# Patient Record
Sex: Female | Born: 1935 | Race: Black or African American | Hispanic: No | Smoking: Never smoker
Health system: Southern US, Community
[De-identification: ages and names within clinical notes are randomized; demographics above are authoritative.]

## PROBLEM LIST (undated history)

## (undated) DIAGNOSIS — G301 Alzheimer's disease with late onset: Secondary | ICD-10-CM

## (undated) DIAGNOSIS — F0280 Dementia in other diseases classified elsewhere without behavioral disturbance: Secondary | ICD-10-CM

## (undated) HISTORY — PX: ABDOMINAL HYSTERECTOMY: SHX81

## (undated) HISTORY — PX: OTHER SURGICAL HISTORY: SHX169

## (undated) HISTORY — PX: THYROID SURGERY: SHX805

---

## 2000-11-23 ENCOUNTER — Other Ambulatory Visit: Admission: RE | Admit: 2000-11-23 | Discharge: 2000-11-23 | Payer: Self-pay | Admitting: Dermatology

## 2002-09-12 ENCOUNTER — Emergency Department (HOSPITAL_COMMUNITY): Admission: EM | Admit: 2002-09-12 | Discharge: 2002-09-13 | Payer: Self-pay | Admitting: Emergency Medicine

## 2003-01-18 ENCOUNTER — Encounter: Payer: Self-pay | Admitting: Emergency Medicine

## 2003-01-18 ENCOUNTER — Emergency Department (HOSPITAL_COMMUNITY): Admission: EM | Admit: 2003-01-18 | Discharge: 2003-01-18 | Payer: Self-pay | Admitting: Emergency Medicine

## 2006-04-13 ENCOUNTER — Ambulatory Visit (HOSPITAL_COMMUNITY): Admission: RE | Admit: 2006-04-13 | Discharge: 2006-04-13 | Payer: Self-pay | Admitting: Family Medicine

## 2008-05-07 ENCOUNTER — Ambulatory Visit (HOSPITAL_COMMUNITY): Admission: RE | Admit: 2008-05-07 | Discharge: 2008-05-07 | Payer: Self-pay | Admitting: Family Medicine

## 2008-05-08 ENCOUNTER — Inpatient Hospital Stay (HOSPITAL_COMMUNITY): Admission: EM | Admit: 2008-05-08 | Discharge: 2008-05-14 | Payer: Self-pay | Admitting: Emergency Medicine

## 2009-02-19 ENCOUNTER — Emergency Department (HOSPITAL_COMMUNITY): Admission: EM | Admit: 2009-02-19 | Discharge: 2009-02-19 | Payer: Self-pay | Admitting: Emergency Medicine

## 2010-06-30 ENCOUNTER — Emergency Department (HOSPITAL_COMMUNITY)
Admission: EM | Admit: 2010-06-30 | Discharge: 2010-06-30 | Disposition: A | Discharge status: Home / Self Care | Payer: Medicare Other | Attending: Emergency Medicine | Admitting: Emergency Medicine

## 2010-06-30 ENCOUNTER — Emergency Department (HOSPITAL_COMMUNITY): Payer: Medicare Other

## 2010-06-30 DIAGNOSIS — R0602 Shortness of breath: Secondary | ICD-10-CM | POA: Insufficient documentation

## 2010-06-30 LAB — DIFFERENTIAL
Basophils Absolute: 0 10*3/uL (ref 0.0–0.1)
Basophils Relative: 1 % (ref 0–1)
Eosinophils Absolute: 0.1 10*3/uL (ref 0.0–0.7)
Eosinophils Relative: 3 % (ref 0–5)
Lymphocytes Relative: 39 % (ref 12–46)

## 2010-06-30 LAB — BASIC METABOLIC PANEL
BUN: 11 mg/dL (ref 6–23)
Chloride: 105 mEq/L (ref 96–112)
Creatinine, Ser: 0.68 mg/dL (ref 0.4–1.2)
GFR calc Af Amer: >60 mL/min (ref >60)

## 2010-06-30 LAB — CBC
HCT: 39.5 % (ref 36.0–46.0)
Hemoglobin: 12.4 g/dL (ref 12.0–15.0)
MCH: 28.2 pg (ref 26.0–34.0)
MCHC: 31.4 g/dL (ref 30.0–36.0)
MCV: 89.8 fL (ref 78.0–100.0)
RBC: 4.4 MIL/uL (ref 3.87–5.11)

## 2010-08-12 LAB — URINE CULTURE

## 2010-08-12 LAB — URINALYSIS, ROUTINE W REFLEX MICROSCOPIC
Bilirubin Urine: NEGATIVE
Glucose, UA: NEGATIVE mg/dL
Ketones, ur: NEGATIVE mg/dL
Protein, ur: NEGATIVE mg/dL
pH: 6 (ref 5.0–8.0)

## 2010-08-12 LAB — DIFFERENTIAL
Basophils Absolute: 0 10*3/uL (ref 0.0–0.1)
Basophils Relative: 0 % (ref 0–1)
Eosinophils Absolute: 0 10*3/uL (ref 0.0–0.7)
Eosinophils Relative: 1 % (ref 0–5)
Lymphs Abs: 0.8 10*3/uL (ref 0.7–4.0)
Neutrophils Relative %: 77 % (ref 43–77)

## 2010-08-12 LAB — BASIC METABOLIC PANEL
BUN: 14 mg/dL (ref 6–23)
CO2: 28 mEq/L (ref 19–32)
Chloride: 104 mEq/L (ref 96–112)
Creatinine, Ser: 0.75 mg/dL (ref 0.4–1.2)
GFR calc Af Amer: >60 mL/min (ref >60)
Glucose, Bld: 91 mg/dL (ref 70–99)

## 2010-08-12 LAB — URINE MICROSCOPIC-ADD ON

## 2010-08-12 LAB — POCT CARDIAC MARKERS
CKMB, poc: 4.1 ng/mL (ref 1.0–8.0)
Myoglobin, poc: 118 ng/mL (ref 12–200)
Myoglobin, poc: 145 ng/mL (ref 12–200)

## 2010-08-12 LAB — CBC
MCV: 89.4 fL (ref 78.0–100.0)
Platelets: 266 10*3/uL (ref 150–400)
RBC: 4.29 MIL/uL (ref 3.87–5.11)
WBC: 5.3 10*3/uL (ref 4.0–10.5)

## 2010-08-19 ENCOUNTER — Emergency Department (HOSPITAL_COMMUNITY): Payer: Medicare Other

## 2010-08-19 ENCOUNTER — Emergency Department (HOSPITAL_COMMUNITY)
Admission: EM | Admit: 2010-08-19 | Discharge: 2010-08-19 | Disposition: A | Discharge status: Home / Self Care | Payer: Medicare Other | Attending: Emergency Medicine | Admitting: Emergency Medicine

## 2010-08-19 DIAGNOSIS — K5900 Constipation, unspecified: Secondary | ICD-10-CM | POA: Insufficient documentation

## 2010-08-19 DIAGNOSIS — G309 Alzheimer's disease, unspecified: Secondary | ICD-10-CM | POA: Insufficient documentation

## 2010-08-19 DIAGNOSIS — Z79899 Other long term (current) drug therapy: Secondary | ICD-10-CM | POA: Insufficient documentation

## 2010-08-19 DIAGNOSIS — Z8541 Personal history of malignant neoplasm of cervix uteri: Secondary | ICD-10-CM | POA: Insufficient documentation

## 2010-08-19 DIAGNOSIS — F0280 Dementia in other diseases classified elsewhere without behavioral disturbance: Secondary | ICD-10-CM | POA: Insufficient documentation

## 2010-08-19 DIAGNOSIS — F411 Generalized anxiety disorder: Secondary | ICD-10-CM | POA: Insufficient documentation

## 2010-08-23 LAB — BASIC METABOLIC PANEL
BUN: 12 mg/dL (ref 6–23)
BUN: 15 mg/dL (ref 6–23)
Calcium: 8.7 mg/dL (ref 8.4–10.5)
Chloride: 109 mEq/L (ref 96–112)
Chloride: 120 mEq/L — ABNORMAL HIGH (ref 96–112)
GFR calc Af Amer: >60 mL/min (ref >60)
GFR calc Af Amer: >60 mL/min (ref >60)
GFR calc Af Amer: >60 mL/min (ref >60)
GFR calc non Af Amer: 56 mL/min — ABNORMAL LOW (ref >60)
GFR calc non Af Amer: 56 mL/min — ABNORMAL LOW (ref >60)
Glucose, Bld: 109 mg/dL — ABNORMAL HIGH (ref 70–99)
Potassium: 3.4 mEq/L — ABNORMAL LOW (ref 3.5–5.1)
Potassium: 3.6 mEq/L (ref 3.5–5.1)
Potassium: 4.2 mEq/L (ref 3.5–5.1)
Sodium: 140 mEq/L (ref 135–145)
Sodium: 140 mEq/L (ref 135–145)

## 2010-08-23 LAB — CBC
Hemoglobin: 11.7 g/dL — ABNORMAL LOW (ref 12.0–15.0)
MCHC: 32.3 g/dL (ref 30.0–36.0)
RBC: 3.95 MIL/uL (ref 3.87–5.11)

## 2010-08-23 LAB — TSH: TSH: 1.872 u[IU]/mL (ref 0.350–4.500)

## 2010-08-23 LAB — DIFFERENTIAL
Basophils Absolute: 0 10*3/uL (ref 0.0–0.1)
Basophils Relative: 1 % (ref 0–1)
Neutro Abs: 2.4 10*3/uL (ref 1.7–7.7)
Neutrophils Relative %: 49 % (ref 43–77)

## 2010-09-21 NOTE — Discharge Summary (Signed)
NAME:  Diana Byrd, Diana Byrd                ACCOUNT NO.:  [id number]   MEDICAL RECORD NO.:  [id number]          PATIENT TYPE:  INP   LOCATION:  A315                          FACILITY:  [facility]   PHYSICIAN:  Osvaldo Shipper, MD     DATE OF BIRTH:  1923/11/07   DATE OF ADMISSION:  05/08/2008  DATE OF DISCHARGE:  01/25/2010LH                               DISCHARGE SUMMARY   PRIMARY MEDICAL DOCTOR:  Dr. Regino Schultze with [facility].   Please review H&P dictated at the time of admission for details  regarding the patient's presenting illness.   DISCHARGE DIAGNOSES:  1. UTI.  2. Dehydration with hyponatremia and hypokalemia, improved.  3. Advanced dementia.  4. Right eye conjunctivitis improved.   BRIEF HOSPITAL COURSE:  Briefly, this is an 77 year old African American  female who has advanced dementia who was living with her daughters who  was brought in for increased confusion and the fact that the daughters  were finding it difficult to take care of her at home.  The patient was  evaluated in the ED and was found to have evidence for UTI.  Urine  cultures did not grow any specific organism.  The patient was started on  IV Levaquin which was changed over to p.o.  The patient remained  afebrile.   She was also found to be dehydrated with sodium level of 159, potassium  3.1.  With D5 water, the sodium levels came down to 140, potassium was  also replaced and this came up to 4.2.  Renal function was normal.  The  patient did not have significant behavioral issues while she was  admitted here.  She was given just one dose of Haldol couple of days ago  for agitation and that helped her.   TSH of 1.872.   PHYSICAL EXAMINATION:  GENERAL:  On the day of discharge, actually  today, January 4, the patient is lying comfortably on the bedtime.  She  smiles at me when I talk to her, but does not really communicate.  VITAL SIGNS:  Her vital signs show that her temperature is 97.8, heart  rate  67, respiratory 18, blood pressure 128/70, saturation 99% on room  air.  LUNGS: Clear to auscultation bilaterally.  CARDIOVASCULAR:  S1, S2 normal, regular.  No murmurs appreciated.  ABDOMEN:  Soft, nontender, nondistended.  Bowel sounds present.  No  masses or organomegaly is appreciated.   LABORATORY DATA:  No labs available today, sodium level and potassium  were normal from yesterday.   ASSESSMENT/PLAN:  Patient has been tolerating a p.o. diet quite well.  She is on a mechanical soft diet, but she does require assistance with  feeding.  So overall the patient is stable for discharge.  However, she  needs placement.  I have already informed social worker will work on the  patient's case today.  I am anticipating the patient will have a nursing  home to go to by tomorrow.   DISCHARGE MEDICATIONS:  1. Xanax 0.5 mg three times a day as needed for anxiety.  2. MiraLax 17 grams p.o. daily,  hold for diarrhea.  3. Ciprofloxacin ophthalmic solution 2 drops to right eye four times      daily for 5 days.  4. Levaquin 500 mg once daily for two more days.  5. Magnesium oxide 400 mg orally twice daily for 7 days.   DISCHARGE INSTRUCTIONS:  1. Diet mechanical soft.  The patient requires assistance feeding.  2. Physical activity.  She was seen by physical therapist.  However,      the patient was not motivated enough to ambulate, and did not      really cooperate with PT, so they do not feel that she is a      physical therapy candidate.   CONSULTATIONS:  No consultations obtained during this admission.   IMAGING STUDIES:  A CT head which did not show any acute process, a lot  of advanced atrophy was noted.  Chest x-ray was also done which did not  show any acute findings.   TOTAL TIME ON THIS DISCHARGE ENCOUNTER:  Thirty five minutes.      Osvaldo Shipper, MD  Electronically Signed     GK/MEDQ  D:  05/12/2008  T:  05/12/2008  Job:  [id number]   cc:   Kirk Ruths, M.D.  Fax:  [phone]

## 2010-09-21 NOTE — H&P (Signed)
NAME:  Diana Byrd, Diana Byrd                ACCOUNT NO.:  [id number]   MEDICAL RECORD NO.:  [id number]          PATIENT TYPE:  INP   LOCATION:  A315                          FACILITY:  [facility]   PHYSICIAN:  Wilson Singer, M.D.DATE OF BIRTH:  [birth year]   DATE OF ADMISSION:  05/08/2008  DATE OF DISCHARGE:  LH                              HISTORY & PHYSICAL   HISTORY:  This is a very pleasantly demented and confused 77 year old  lady who comes in with a 2-week history of increasing confusion.  The  history has been given by the daughters who are at the bedside.  The  patient is unable to give me any clear history and she is  noncommunicative.  According to the daughters, the patient has been  diagnosed with dementia well over 10 years ago and has progressively  gotten worse over the years.  She lives with one of the sons and she is  currently at a point where she walks very little and spends most of her  day either sitting or lying.  She requires help with all activities of  daily living.  She does eat, but sometimes requires help and prompting.  She is incontinent of urine and stools and wears diapers.   PAST MEDICAL HISTORY:  Significant for dementia as mentioned above;  remote history of hypothyroidism, but currently not on any replacement  medication; and remote history of hypertension, currently not an issue.   ALLERGIES:  She is allergic to PENICILLIN.   MEDICATIONS:  No known medications at the present time.   PAST SURGICAL HISTORY:  Thyroidectomy for goiters and hysterectomy.   SOCIAL HISTORY:  She is widowed and lives with her son.  She does not  smoke, does not drink alcohol.  There is no history of drug abuse.   FAMILY HISTORY:  Noncontributory.   REVIEW OF SYSTEMS:  She was not able to give me any clear history.   PHYSICAL EXAMINATION:  GENERAL:  The patient is afebrile and  hemodynamically stable.  She looks slightly dehydrated clinically.  CARDIOVASCULAR:  Heart sounds  are present and normal.  RESPIRATORY:  Lung fields are clear.  ABDOMEN:  Soft and nontender with no hepatosplenomegaly.  NEUROLOGICAL:  She appears to be somewhat alert, but clearly not  orientated to time, place, and person and she is not able to  communicate.  She looks somewhat delirious.   INVESTIGATIONS:  Urine microscopy shows many bacteria and is positive  for nitrites.  Sodium 159, potassium 3.1, chloride 123, bicarbonate 30,  glucose 108, BUN 17, creatinine 0.97, and albumin 3.4.  Liver enzymes  normal.  Hemoglobin 13.5, white blood cell count 4.9, and platelets 225.   IMPRESSION:  1. Urinary tract infection.  2. Dehydration with hyponatremia.  3. Advanced dementia.   PLAN:  1. Admit.  2. Intravenous fluids and intravenous antibiotics empirically.  We      will blood culture if she has fever.  3. Discussed cord status with the patient's daughter and they will      consider this.  4. Monitor electrolytes closely.   Further  recommendations will depend on the patient's hospital progress.      Wilson Singer, M.D.  Electronically Signed     NCG/MEDQ  D:  05/08/2008  T:  05/09/2008  Job:  [id number]

## 2010-09-21 NOTE — Discharge Summary (Signed)
NAME:  Diana Byrd, Diana Byrd                ACCOUNT NO.:  [id number]   MEDICAL RECORD NO.:  [id number]          PATIENT TYPE:  INP   LOCATION:  A315                          FACILITY:  [facility]   PHYSICIAN:  Osvaldo Shipper, MD     DATE OF BIRTH:  1923-11-27   DATE OF ADMISSION:  05/08/2008  DATE OF DISCHARGE:  01/05/2010LH                               DISCHARGE SUMMARY   ADDENDUM TO DISCHARGE SUMMARY:  The patient still at [facility], nursing home could not be found yesterday.  We are hoping that  a bed will be available later today or tomorrow.  The patient continues  to be medically stable.  She denies any complaints.  She is sitting down  on the chair looking in no distress, looking very comfortable.  Vital  signs are all stable.  She is eating 75% of her meals.  No blood work  has been checked in 2 days.  Her lungs are clear to auscultation.  Her  daughter thinks that she appears to be a little weak, but as far as  vital signs and exam are concerned, she looks quite stable at this time.  She still has a Foley catheter, so that will be discontinued.   MEDICATIONS:  No changes to her medications from yesterday.  Please see  the list dictated in yesterday's discharge summary.   RECOMMENDATIONS:  Please also look at all of the other recommendations  dictated in the discharge summary dictated yesterday.   I also want to mention that code status was discussed the family, and  after much discussion, they have decided to make this patient a no code,  so she is a DNR/DNI.   Time of discharge greater than 30 minutes.      Osvaldo Shipper, MD  Electronically Signed     GK/MEDQ  D:  05/13/2008  T:  05/13/2008  Job:  [id number]

## 2011-02-10 LAB — URINALYSIS, ROUTINE W REFLEX MICROSCOPIC
Ketones, ur: NEGATIVE mg/dL
Leukocytes, UA: NEGATIVE
Nitrite: POSITIVE — AB

## 2011-02-10 LAB — CBC
HCT: 42.1 % (ref 36.0–46.0)
Hemoglobin: 13.5 g/dL (ref 12.0–15.0)
MCHC: 32 g/dL (ref 30.0–36.0)
Platelets: 225 10*3/uL (ref 150–400)
RDW: 14.4 % (ref 11.5–15.5)

## 2011-02-10 LAB — DIFFERENTIAL
Basophils Relative: 0 % (ref 0–1)
Lymphocytes Relative: 23 % (ref 12–46)
Lymphs Abs: 1.1 10*3/uL (ref 0.7–4.0)
Monocytes Relative: 7 % (ref 3–12)
Neutro Abs: 3.3 10*3/uL (ref 1.7–7.7)
Neutrophils Relative %: 68 % (ref 43–77)

## 2011-02-10 LAB — COMPREHENSIVE METABOLIC PANEL
BUN: 17 mg/dL (ref 6–23)
Calcium: 9.1 mg/dL (ref 8.4–10.5)
Creatinine, Ser: 0.97 mg/dL (ref 0.4–1.2)
Glucose, Bld: 108 mg/dL — ABNORMAL HIGH (ref 70–99)
Total Protein: 7.1 g/dL (ref 6.0–8.3)

## 2011-02-10 LAB — URINE CULTURE

## 2011-06-14 DIAGNOSIS — I10 Essential (primary) hypertension: Secondary | ICD-10-CM | POA: Diagnosis not present

## 2011-06-14 DIAGNOSIS — J069 Acute upper respiratory infection, unspecified: Secondary | ICD-10-CM | POA: Diagnosis not present

## 2011-08-29 DIAGNOSIS — J309 Allergic rhinitis, unspecified: Secondary | ICD-10-CM | POA: Diagnosis not present

## 2011-08-29 DIAGNOSIS — I10 Essential (primary) hypertension: Secondary | ICD-10-CM | POA: Diagnosis not present

## 2011-08-29 DIAGNOSIS — E039 Hypothyroidism, unspecified: Secondary | ICD-10-CM | POA: Diagnosis not present

## 2011-08-29 DIAGNOSIS — M064 Inflammatory polyarthropathy: Secondary | ICD-10-CM | POA: Diagnosis not present

## 2011-11-16 DIAGNOSIS — L851 Acquired keratosis [keratoderma] palmaris et plantaris: Secondary | ICD-10-CM | POA: Diagnosis not present

## 2011-11-16 DIAGNOSIS — B351 Tinea unguium: Secondary | ICD-10-CM | POA: Diagnosis not present

## 2011-11-16 DIAGNOSIS — I739 Peripheral vascular disease, unspecified: Secondary | ICD-10-CM | POA: Diagnosis not present

## 2012-04-16 DIAGNOSIS — Z681 Body mass index (BMI) 19 or less, adult: Secondary | ICD-10-CM | POA: Diagnosis not present

## 2012-04-16 DIAGNOSIS — N760 Acute vaginitis: Secondary | ICD-10-CM | POA: Diagnosis not present

## 2012-04-16 DIAGNOSIS — B356 Tinea cruris: Secondary | ICD-10-CM | POA: Diagnosis not present

## 2012-04-22 ENCOUNTER — Emergency Department (HOSPITAL_COMMUNITY)
Admission: EM | Admit: 2012-04-22 | Discharge: 2012-04-22 | Disposition: A | Discharge status: Home / Self Care | Payer: Medicare Other | Attending: Emergency Medicine | Admitting: Emergency Medicine

## 2012-04-22 ENCOUNTER — Emergency Department (HOSPITAL_COMMUNITY): Payer: Medicare Other

## 2012-04-22 ENCOUNTER — Encounter (HOSPITAL_COMMUNITY): Payer: Self-pay

## 2012-04-22 DIAGNOSIS — G309 Alzheimer's disease, unspecified: Secondary | ICD-10-CM | POA: Diagnosis not present

## 2012-04-22 DIAGNOSIS — Z79899 Other long term (current) drug therapy: Secondary | ICD-10-CM | POA: Insufficient documentation

## 2012-04-22 DIAGNOSIS — J90 Pleural effusion, not elsewhere classified: Secondary | ICD-10-CM | POA: Diagnosis not present

## 2012-04-22 DIAGNOSIS — Z8541 Personal history of malignant neoplasm of cervix uteri: Secondary | ICD-10-CM | POA: Insufficient documentation

## 2012-04-22 DIAGNOSIS — R4182 Altered mental status, unspecified: Secondary | ICD-10-CM

## 2012-04-22 DIAGNOSIS — R5383 Other fatigue: Secondary | ICD-10-CM | POA: Diagnosis not present

## 2012-04-22 DIAGNOSIS — F0390 Unspecified dementia without behavioral disturbance: Secondary | ICD-10-CM | POA: Diagnosis not present

## 2012-04-22 DIAGNOSIS — N390 Urinary tract infection, site not specified: Secondary | ICD-10-CM | POA: Diagnosis not present

## 2012-04-22 DIAGNOSIS — F0280 Dementia in other diseases classified elsewhere without behavioral disturbance: Secondary | ICD-10-CM | POA: Diagnosis not present

## 2012-04-22 HISTORY — DX: Dementia in other diseases classified elsewhere, unspecified severity, without behavioral disturbance, psychotic disturbance, mood disturbance, and anxiety: F02.80

## 2012-04-22 HISTORY — DX: Alzheimer's disease with late onset: G30.1

## 2012-04-22 LAB — URINALYSIS, ROUTINE W REFLEX MICROSCOPIC
Bilirubin Urine: NEGATIVE
Hgb urine dipstick: NEGATIVE
Specific Gravity, Urine: >1.03 — ABNORMAL HIGH (ref 1.005–1.030)
pH: 6 (ref 5.0–8.0)

## 2012-04-22 LAB — CBC WITH DIFFERENTIAL/PLATELET
Basophils Relative: 1 % (ref 0–1)
HCT: 40.2 % (ref 36.0–46.0)
Hemoglobin: 13.1 g/dL (ref 12.0–15.0)
MCHC: 32.6 g/dL (ref 30.0–36.0)
Monocytes Absolute: 0.4 10*3/uL (ref 0.1–1.0)
Monocytes Relative: 9 % (ref 3–12)
Neutro Abs: 2.9 10*3/uL (ref 1.7–7.7)

## 2012-04-22 LAB — BASIC METABOLIC PANEL
BUN: 11 mg/dL (ref 6–23)
CO2: 27 mEq/L (ref 19–32)
Chloride: 104 mEq/L (ref 96–112)
GFR calc Af Amer: 86 mL/min — ABNORMAL LOW (ref >90)
Glucose, Bld: 114 mg/dL — ABNORMAL HIGH (ref 70–99)
Potassium: 4 mEq/L (ref 3.5–5.1)

## 2012-04-22 LAB — URINE MICROSCOPIC-ADD ON

## 2012-04-22 MED ORDER — CEPHALEXIN 250 MG/5ML PO SUSR: 1000.0000 mg | Freq: Two times a day (BID) | ORAL | Status: DC

## 2012-04-22 MED ORDER — CEFTRIAXONE SODIUM 1 G IJ SOLR
1.0000 g | Freq: Once | INTRAMUSCULAR | Status: AC
  Administered 2012-04-22: 1 g via INTRAMUSCULAR
  Filled 2012-04-22: qty 10

## 2012-04-22 NOTE — ED Provider Notes (Signed)
History     CSN: [id number]  Arrival date & time 04/22/12  4098   First MD Initiated Contact with Patient 04/22/12 2012      Chief Complaint  Patient presents with  . Altered Mental Status    (Consider location/radiation/quality/duration/timing/severity/associated sxs/prior treatment) HPI This 78 year old female has severe dementia and is nonverbal and essentially bedridden at baseline and lives at home with her family who takes care of her. The patient needs complete assistance for activities of daily living and with assistance is able to transfer from the bed to the wheelchair or from the bed to the commode briefly but is chronically incontinent at baseline. Today the patient had a spell for about 20 minutes where she was less responsive than usual. Usually she is awake alert and smiles but does not follow commands. Today she appear to be napping and when they tried to wake her up from her nap she seemed to be drooling and does not smile or track well with her eyes like usual. There is no trauma and no apparent seizure-like activity. Patient upon arrival to the ED is back to her baseline smiling awake alert status looking around the room but not following commands which is baseline for her. She does not appear to be in any respiratory distress and she's had no vomiting no fever no rash and no apparent pain. She is back to baseline now according to her family with her today. Past Medical History  Diagnosis Date  . 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 Cervical cancer   . Abdominal hysterectomy   . Thyroid surgery     No family history on file.  History  Substance Use Topics  . Smoking status: Not on file  . Smokeless tobacco: Not on file  . Alcohol Use:     OB History    Grav Para Term Preterm Abortions TAB SAB Ect Mult Living                  Review of Systems  Unable to perform ROS: Dementia    Allergies   Penicillins  Home Medications   Current Outpatient Rx  Name  Route  Sig  Dispense  Refill  . CALCIUM-MAGNESIUM 200-50 MG PO TABS   Oral   Take 1 tablet by mouth daily.         . CEPHALEXIN 250 MG/5ML PO SUSR   Oral   Take 20 mLs (1,000 mg total) by mouth 2 (two) times daily. X 7 days   300 mL   0     BP 115/92  Pulse 74  Temp 97.7 F (36.5 C) (Axillary)  Resp 14  Ht 5\' 5"  (1.651 m)  Wt 155 lb (70.308 kg)  BMI 25.79 kg/m2  SpO2 100%  Physical Exam  Nursing note and vitals reviewed. Constitutional:       Awake, alert, nontoxic appearance.  HENT:  Head: Atraumatic.  Eyes: Right eye exhibits no discharge. Left eye exhibits no discharge.  Neck: Neck supple.  Cardiovascular: Normal rate and regular rhythm.   No murmur heard. Pulmonary/Chest: Effort normal and breath sounds normal. No respiratory distress. She has no wheezes. She has no rales. She exhibits no tenderness.  Abdominal: Soft. There is no tenderness. There is no rebound.  Musculoskeletal: She exhibits no edema and no tenderness.       Baseline ROM, no obvious new focal weakness.  Neurological: She is alert.  Mental status and motor strength appears baseline for patient and situation. Awake alert smiles moves all 4 extremities without apparent focal weakness and has good strong hand grasp with both hands.  Skin: No rash noted.  Psychiatric: She has a normal mood and affect.    ED Course  Procedures (including critical care time) Pt asymptomatic in ED. Labs Reviewed  BASIC METABOLIC PANEL - Abnormal; Notable for the following:    Glucose, Bld 114 (*)     GFR calc non Af Amer 75 (*)     GFR calc Af Amer 86 (*)     All other components within normal limits  URINALYSIS, ROUTINE W REFLEX MICROSCOPIC - Abnormal; Notable for the following:    Specific Gravity, Urine >1.030 (*)     Urobilinogen, UA 2.0 (*)     Nitrite POSITIVE (*)     All other components within normal limits  URINE MICROSCOPIC-ADD  ON - Abnormal; Notable for the following:    Bacteria, UA MANY (*)     All other components within normal limits  CBC WITH DIFFERENTIAL  URINE CULTURE   Dg Chest 1 View  04/22/2012  *RADIOLOGY REPORT*  Clinical Data: Altered mental status, weakness.  CHEST - 1 VIEW  Comparison: 06/30/2010  Findings: Tortuous aorta.  Cardiomegaly.  Small left pleural effusion.  Bibasilar opacities.  Probable hiatal hernia.  Right paratracheal opacity is unchanged as is a nodular opacity projecting over the right lower lung.  Background interstitial prominence.  No pneumothorax.  Diffuse osteopenia.  Median sternotomy.  Arch atherosclerosis.  IMPRESSION: Cardiomegaly and tortuous aorta, similar to prior.  Small left pleural effusion.  Bibasilar opacities are predominately chronic; cannot exclude a superimposed process such as atelectasis, aspiration, or infiltrate.  Unchanged though nonspecific right paratracheal fullness.   Original Report Authenticated By: Jearld Lesch, M.D.      1. Altered mental status   2. Dementia   3. Urinary tract infection       MDM  Patient / Family / Caregiver informed of clinical course, understand medical decision-making process, and agree with plan.  I doubt any other EMC precluding discharge at this time including, but not necessarily limited to the following:sepsis.        Hurman Horn, MD 04/23/12 [phone]

## 2012-04-22 NOTE — ED Notes (Signed)
Late stages of Alzheimer's. Family states that she is different today because she is not opening her eyes. Family states that she was sweating and that she had food on her clothing and they did not know if she had aspirated.

## 2012-04-28 LAB — URINE CULTURE

## 2012-06-21 ENCOUNTER — Emergency Department (HOSPITAL_COMMUNITY)
Admission: EM | Admit: 2012-06-21 | Discharge: 2012-06-22 | Disposition: A | Discharge status: Home / Self Care | Payer: Medicare Other | Attending: Emergency Medicine | Admitting: Emergency Medicine

## 2012-06-21 ENCOUNTER — Emergency Department (HOSPITAL_COMMUNITY): Payer: Medicare Other

## 2012-06-21 ENCOUNTER — Encounter (HOSPITAL_COMMUNITY): Payer: Self-pay | Admitting: *Deleted

## 2012-06-21 ENCOUNTER — Other Ambulatory Visit: Payer: Self-pay

## 2012-06-21 DIAGNOSIS — K5730 Diverticulosis of large intestine without perforation or abscess without bleeding: Secondary | ICD-10-CM | POA: Diagnosis not present

## 2012-06-21 DIAGNOSIS — F0280 Dementia in other diseases classified elsewhere without behavioral disturbance: Secondary | ICD-10-CM | POA: Diagnosis not present

## 2012-06-21 DIAGNOSIS — F0390 Unspecified dementia without behavioral disturbance: Secondary | ICD-10-CM

## 2012-06-21 DIAGNOSIS — R109 Unspecified abdominal pain: Secondary | ICD-10-CM | POA: Diagnosis not present

## 2012-06-21 DIAGNOSIS — Z79899 Other long term (current) drug therapy: Secondary | ICD-10-CM | POA: Insufficient documentation

## 2012-06-21 DIAGNOSIS — R142 Eructation: Secondary | ICD-10-CM | POA: Diagnosis not present

## 2012-06-21 DIAGNOSIS — N390 Urinary tract infection, site not specified: Secondary | ICD-10-CM | POA: Insufficient documentation

## 2012-06-21 DIAGNOSIS — R141 Gas pain: Secondary | ICD-10-CM | POA: Diagnosis not present

## 2012-06-21 DIAGNOSIS — G309 Alzheimer's disease, unspecified: Secondary | ICD-10-CM | POA: Insufficient documentation

## 2012-06-21 DIAGNOSIS — R4182 Altered mental status, unspecified: Secondary | ICD-10-CM | POA: Diagnosis not present

## 2012-06-21 DIAGNOSIS — R143 Flatulence: Secondary | ICD-10-CM | POA: Diagnosis not present

## 2012-06-21 DIAGNOSIS — I4949 Other premature depolarization: Secondary | ICD-10-CM | POA: Diagnosis not present

## 2012-06-21 DIAGNOSIS — R404 Transient alteration of awareness: Secondary | ICD-10-CM | POA: Diagnosis not present

## 2012-06-21 DIAGNOSIS — R5383 Other fatigue: Secondary | ICD-10-CM | POA: Diagnosis not present

## 2012-06-21 LAB — CBC WITH DIFFERENTIAL/PLATELET
Basophils Relative: 1 % (ref 0–1)
Eosinophils Absolute: 0.1 10*3/uL (ref 0.0–0.7)
HCT: 41 % (ref 36.0–46.0)
Hemoglobin: 13.4 g/dL (ref 12.0–15.0)
MCH: 28.5 pg (ref 26.0–34.0)
MCHC: 32.7 g/dL (ref 30.0–36.0)
Monocytes Absolute: 0.3 10*3/uL (ref 0.1–1.0)
Monocytes Relative: 10 % (ref 3–12)

## 2012-06-21 LAB — COMPREHENSIVE METABOLIC PANEL
Albumin: 3.8 g/dL (ref 3.5–5.2)
BUN: 7 mg/dL (ref 6–23)
Chloride: 100 mEq/L (ref 96–112)
Creatinine, Ser: 0.74 mg/dL (ref 0.50–1.10)
GFR calc Af Amer: 87 mL/min — ABNORMAL LOW (ref >90)
Total Bilirubin: 0.3 mg/dL (ref 0.3–1.2)
Total Protein: 8.6 g/dL — ABNORMAL HIGH (ref 6.0–8.3)

## 2012-06-21 LAB — URINE MICROSCOPIC-ADD ON

## 2012-06-21 LAB — URINALYSIS, ROUTINE W REFLEX MICROSCOPIC
Ketones, ur: NEGATIVE mg/dL
Leukocytes, UA: NEGATIVE
Nitrite: NEGATIVE
Urobilinogen, UA: 2 mg/dL — ABNORMAL HIGH (ref 0.0–1.0)
pH: 6 (ref 5.0–8.0)

## 2012-06-21 LAB — TROPONIN I: Troponin I: <0.3 ng/mL (ref <0.30)

## 2012-06-21 LAB — GLUCOSE, CAPILLARY: Glucose-Capillary: 122 mg/dL — ABNORMAL HIGH (ref 70–99)

## 2012-06-21 MED ORDER — CEPHALEXIN 500 MG PO CAPS: 500.0000 mg | ORAL_CAPSULE | Freq: Four times a day (QID) | ORAL | Status: DC

## 2012-06-21 MED ORDER — DEXTROSE 5 % IV SOLN
1.0000 g | Freq: Once | INTRAVENOUS | Status: AC
  Administered 2012-06-21: 1 g via INTRAVENOUS
  Filled 2012-06-21: qty 10

## 2012-06-21 MED ORDER — LEVOFLOXACIN 500 MG PO TABS: 500.0000 mg | ORAL_TABLET | Freq: Every day | ORAL | Status: DC

## 2012-06-21 MED ORDER — IOHEXOL 300 MG/ML  SOLN
100.0000 mL | Freq: Once | INTRAMUSCULAR | Status: AC | PRN
  Administered 2012-06-21: 100 mL via INTRAVENOUS

## 2012-06-21 NOTE — ED Provider Notes (Signed)
History     CSN: [id number]  Arrival date & time 06/21/12  5621   First MD Initiated Contact with Patient 06/21/12 1818      Chief Complaint  Patient presents with  . Altered Mental Status    (Consider location/radiation/quality/duration/timing/severity/associated sxs/prior treatment) HPI Comments: Patient has a history of severe dementia and is nonverbal at baseline. She is dependent on her family for her ADLs. She is a has history of vagal syncope after bowel movements. Family was concerned because patient had a gagging episode earlier. EMS states she is at her baseline mentation. There's been no reported vomiting, fever or cough.  Family states patient had an episode of decreased responsiveness while sitting in her recliner. They noticed she was not as alert as usual and that though she had was having a vagal episode. She appeared to be sleeping and drooling. She did not track her eyes or smile as usual. She then proceeded to gag and cough and they were concerned that she may have an infection. She is now back to baseline. She is alert and interactive with her family.  The history is provided by the EMS personnel and a relative. The history is limited by the condition of the patient.    Past Medical History  Diagnosis Date  . 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 Cervical cancer    . Abdominal hysterectomy    . Thyroid surgery      History reviewed. No pertinent family history.  History  Substance Use Topics  . Smoking status: Not on file  . Smokeless tobacco: Not on file  . Alcohol Use: No    OB History   Grav Para Term Preterm Abortions TAB SAB Ect Mult Living                  Review of Systems  Unable to perform ROS: Dementia  Psychiatric/Behavioral: Positive for altered mental status.    Allergies  Penicillins  Home Medications   Current Outpatient Rx  Name  Route  Sig  Dispense   Refill  . CRANBERRY EXTRACT PO   Oral   Take 1 capsule by mouth daily.         . mupirocin ointment (BACTROBAN) 2 %   Topical   Apply 1 application topically daily as needed (for open wound/blisters).         . nystatin-triamcinolone (MYCOLOG II) cream   Topical   Apply 1 application topically daily as needed (for irritation).         Marland Kitchen OVER THE COUNTER MEDICATION   Oral   Take 10 mLs by mouth daily. CalMax is a high-quality, versatile, drinkable powdered supplement packaged in a 5oz container with approximately 20 servings (roughly 2 teaspoons per serving) and contains: .200 mg magnesium .400 mg of calcium  CalMax CalMax,  the perfect, all-in-one supplement solution for your bones, muscles and heart, helps manage stress and prepares your body for your best rest. CalMax  is a high-quality, versatile, drinkable supplement with Magnesium, Calcium, and Vitamin C that works in harmony to help your body with cardiovascular functions, synthesis of energy, heart rate, muscle contraction, every day stress and so much more. Check out our CalMax all-in-one supplement to see how it benefits your health!  The need for calcium and magnesium is so much more complex than just building and maintaining strong bones. An adequate intake of daily dietary calcium is  required to control the heart rate, blood clotting, muscle contraction, and much more. And magnesium is an important co-factor in over 300 enzymatic reactions in the human body, contributing to the production of cardiovascular functions, and the production and synthesis of energy. Calcium and magnesium work in harmony to fight the damage of everyday stress  .500 mg of vitamin C         . levofloxacin (LEVAQUIN) 500 MG tablet   Oral   Take 1 tablet (500 mg total) by mouth daily.   14 tablet   0     BP 155/78  Pulse 80  Temp(Src) 98.6 F (37 C) (Rectal)  Resp 20  SpO2 94%  Physical Exam  Constitutional: She appears  well-developed and well-nourished. No distress.  HENT:  Head: Normocephalic and atraumatic.  Mouth/Throat: Oropharynx is clear and moist.  Eyes: Conjunctivae and EOM are normal. Pupils are equal, round, and reactive to light.  Neck: Neck supple.  Cardiovascular: Normal rate, regular rhythm and normal heart sounds.   No murmur heard. Pulmonary/Chest: Effort normal and breath sounds normal. No respiratory distress.  Abdominal: Soft. There is no tenderness. There is no rebound and no guarding.  Genitourinary: Guaiac positive stool.  No fecal impaction, normal rectal tone  Musculoskeletal: Normal range of motion. She exhibits no edema and no tenderness.  Neurological: She is alert. No cranial nerve deficit.  Alert, nonverbal, does not follow commands, moving all extremities spontaneously.  Skin: Skin is warm.    ED Course  Procedures (including critical care time)  Labs Reviewed  CBC WITH DIFFERENTIAL - Abnormal; Notable for the following:    WBC 3.5 (*)    Neutrophils Relative 42 (*)    Neutro Abs 1.5 (*)    All other components within normal limits  COMPREHENSIVE METABOLIC PANEL - Abnormal; Notable for the following:    Potassium 3.4 (*)    Glucose, Bld 116 (*)    Total Protein 8.6 (*)    GFR calc non Af Amer 75 (*)    GFR calc Af Amer 87 (*)    All other components within normal limits  URINALYSIS, ROUTINE W REFLEX MICROSCOPIC - Abnormal; Notable for the following:    Specific Gravity, Urine >1.030 (*)    Protein, ur 30 (*)    Urobilinogen, UA 2.0 (*)    All other components within normal limits  GLUCOSE, CAPILLARY - Abnormal; Notable for the following:    Glucose-Capillary 122 (*)    All other components within normal limits  URINE MICROSCOPIC-ADD ON - Abnormal; Notable for the following:    Squamous Epithelial / LPF FEW (*)    Bacteria, UA FEW (*)    All other components within normal limits  URINE CULTURE  TROPONIN I  PRO B NATRIURETIC PEPTIDE   Ct Head Wo  Contrast  06/21/2012  *RADIOLOGY REPORT*  Clinical Data: Altered mental status, dementia, Alzeimer's disease  CT HEAD WITHOUT CONTRAST  Technique:  Contiguous axial images were obtained from the base of the skull through the vertex without contrast.  Comparison: Head CT - 05/08/2008  Findings:  Examination is degraded secondary to patient motion artifact necessitating the acquisition of additional images.  Redemonstrated age advanced atrophy with prominence of the bifrontal extra-axial spaces and commiserate ex vacuo dilatation of the ventricular system.  Scattered periventricular hypodensities compatible with microvascular ischemic disease.  Given background parenchymal abnormalities and patient motion artifact, there is no definitive evidence of acute large territory infarct.  No intraparenchymal or extra-axial mass  or hemorrhage.  Grossly unchanged sized and configuration of the ventricles and basilar cisterns.  No midline shift.  Limited visualization of the paranasal sinuses and mastoid air cells are normal.  Regional soft tissues are normal.  No displaced calvarial fracture.  IMPRESSION: 1.  Degraded examination without acute intracranial process. 2.  Advanced atrophy and microvascular ischemic disease.   Original Report Authenticated By: Tacey Ruiz, MD    Ct Abdomen Pelvis W Contrast  06/21/2012  *RADIOLOGY REPORT*  Clinical Data: Altered mental status.  CT ABDOMEN AND PELVIS WITH CONTRAST  Technique:  Multidetector CT imaging of the abdomen and pelvis was performed following the standard protocol during bolus administration of intravenous contrast.  Contrast: OMNIPAQUE IOHEXOL 300 MG/ML  SOLN  Comparison: 06/21/2012  Findings: Atelectasis or scarring noted in both lower lobes. The liver, spleen, pancreas, and adrenal glands appear unremarkable.  The gallbladder and biliary system appear unremarkable.  No pathologic retroperitoneal or porta hepatis adenopathy is identified.  The kidneys appear  unremarkable, as do the proximal ureters.  Urinary bladder unremarkable. No pathologic pelvic adenopathy is identified.  Redundant sigmoid colon noted with scattered diverticula.  No active diverticulitis observed.  Appendix not well seen. The amount of stool in the colon is within normal limits.  IMPRESSION:  1.  Atelectasis or scarring noted in both lower lobes.   Original Report Authenticated By: Gaylyn Rong, M.D.    Dg Abd Acute W/chest  06/21/2012  *RADIOLOGY REPORT*  Clinical Data: Altered mental status, abdominal pain  ACUTE ABDOMEN SERIES (ABDOMEN 2 VIEW & CHEST 1 VIEW)  Comparison: Chest radiograph - 04/22/2012; 06/30/2010; 02/19/2009; abdominal radiograph - 08/19/2010  Findings:  Grossly unchanged cardiac silhouette and mediastinal contours gave an reduced lung volumes.  Prominence along the right paratracheal stripe is unchanged since the 2010 examination again favored to be vascular in etiology.  Atherosclerotic calcifications of the aortic arch.  Post median sternotomy.  Pulmonary vasculature is less distinct on the present examination with cephalization of flow. Query trace bilateral effusions, left greater than right.  Grossly unchanged bibasilar opacities, favored to represent atelectasis. No definite pneumothorax.  Moderate rather diffuse gaseous distension of the colon.  No definite gaseous distension of the small bowel.  No definite pneumoperitoneum, pneumatosis or portal venous gas.  No definite abnormal intra-abdominal calcifications.  Osteopenia without definite acute osseous abnormality.  IMPRESSION: 1.  Decreased lung volumes with findings suggestive of pulmonary edema, though note, atypical infection may have a similar appearance. 2.  Unchanged soft tissue prominence along the right paratracheal stripe, stable since the 2010 examination and favored to be vascular in etiology.  3.  Moderate rather diffuse gaseous distension of the colon is suggestive of ileus, less likely distal  colonic obstruction.   Original Report Authenticated By: Tacey Ruiz, MD      1. Dementia   2. Urinary tract infection       MDM  Episode of decreased responsiveness, similar to previous. Now back to baseline. Alert and moving all extremities.  Labs unremarkable.  Will treat UTI.  CT will be obtained to evaluate colonic distension seen on AAS.  No evidence of obstruction on CT scan. Will treat UTI.  At baseline per family. Hemoglobin stable.  Follow up with PCP.    Date: 06/21/2012  Rate: 92  Rhythm: normal sinus rhythm and premature ventricular contractions (PVC)  QRS Axis: left  Intervals: normal  ST/T Wave abnormalities: nonspecific ST/T changes  Conduction Disutrbances:none  Narrative Interpretation:   Old EKG  Reviewed: unchanged    Glynn Octave, MD 06/21/12 2123

## 2012-06-21 NOTE — ED Notes (Signed)
Pt continues to rest quietly and comfortably.  Awaiting on EMS for transport home.

## 2012-06-21 NOTE — ED Notes (Signed)
Discharge instructions reviewed with family.  No questions verbalized.  Pt to be transported home via EMS due to inability to ambulate.

## 2012-06-21 NOTE — ED Notes (Signed)
Pt to department via EMS.  Per EMS, family states pt has a history of vagal response following bowel movements and wish to have her evaluated. Family states that pt also gagged earlier.  Per EMS, family states that pt has returned to baseline mental status.  Presently pt responds to physical stimulus, but does not verbalize or interact with staff.  EMS reports blood sugar on scene was 160.

## 2012-06-22 DIAGNOSIS — R279 Unspecified lack of coordination: Secondary | ICD-10-CM | POA: Diagnosis not present

## 2012-06-22 NOTE — ED Notes (Signed)
EMS to department to transport pt home.  

## 2012-06-24 LAB — URINE CULTURE

## 2012-06-25 NOTE — ED Notes (Signed)
+   Urine Patient treated with Levaquin-sensitive to same-chart appended per protocol MD. 

## 2012-06-28 DIAGNOSIS — Z681 Body mass index (BMI) 19 or less, adult: Secondary | ICD-10-CM | POA: Diagnosis not present

## 2012-06-28 DIAGNOSIS — I10 Essential (primary) hypertension: Secondary | ICD-10-CM | POA: Diagnosis not present

## 2012-06-28 DIAGNOSIS — Z79899 Other long term (current) drug therapy: Secondary | ICD-10-CM | POA: Diagnosis not present

## 2012-06-28 DIAGNOSIS — R55 Syncope and collapse: Secondary | ICD-10-CM | POA: Diagnosis not present

## 2012-06-28 DIAGNOSIS — R5381 Other malaise: Secondary | ICD-10-CM | POA: Diagnosis not present

## 2012-06-28 DIAGNOSIS — G309 Alzheimer's disease, unspecified: Secondary | ICD-10-CM | POA: Diagnosis not present

## 2012-07-06 ENCOUNTER — Encounter (HOSPITAL_COMMUNITY): Payer: Self-pay | Admitting: Emergency Medicine

## 2012-07-06 ENCOUNTER — Emergency Department (HOSPITAL_COMMUNITY)
Admission: EM | Admit: 2012-07-06 | Discharge: 2012-07-07 | Disposition: A | Discharge status: Home / Self Care | Payer: Medicare Other | Attending: Emergency Medicine | Admitting: Emergency Medicine

## 2012-07-06 DIAGNOSIS — R633 Feeding difficulties: Secondary | ICD-10-CM | POA: Diagnosis not present

## 2012-07-06 DIAGNOSIS — I10 Essential (primary) hypertension: Secondary | ICD-10-CM | POA: Diagnosis not present

## 2012-07-06 DIAGNOSIS — K5901 Slow transit constipation: Secondary | ICD-10-CM | POA: Diagnosis not present

## 2012-07-06 DIAGNOSIS — F0280 Dementia in other diseases classified elsewhere without behavioral disturbance: Secondary | ICD-10-CM | POA: Diagnosis not present

## 2012-07-06 DIAGNOSIS — Z79899 Other long term (current) drug therapy: Secondary | ICD-10-CM | POA: Insufficient documentation

## 2012-07-06 DIAGNOSIS — K5900 Constipation, unspecified: Secondary | ICD-10-CM | POA: Insufficient documentation

## 2012-07-06 DIAGNOSIS — R55 Syncope and collapse: Secondary | ICD-10-CM | POA: Insufficient documentation

## 2012-07-06 DIAGNOSIS — Z5189 Encounter for other specified aftercare: Secondary | ICD-10-CM | POA: Diagnosis not present

## 2012-07-06 DIAGNOSIS — E039 Hypothyroidism, unspecified: Secondary | ICD-10-CM | POA: Diagnosis not present

## 2012-07-06 DIAGNOSIS — Z9181 History of falling: Secondary | ICD-10-CM | POA: Diagnosis not present

## 2012-07-06 DIAGNOSIS — M6281 Muscle weakness (generalized): Secondary | ICD-10-CM | POA: Diagnosis not present

## 2012-07-06 DIAGNOSIS — R4182 Altered mental status, unspecified: Secondary | ICD-10-CM | POA: Diagnosis not present

## 2012-07-06 DIAGNOSIS — G309 Alzheimer's disease, unspecified: Secondary | ICD-10-CM | POA: Insufficient documentation

## 2012-07-06 NOTE — ED Notes (Signed)
Pt presents from home with AMS. Per family "she has not been acting herself lately" and per family she appears to be grimicing in pain at times. Per EMS CBG 119.

## 2012-07-06 NOTE — ED Provider Notes (Signed)
History    Scribed for [employer]. Colon Branch, MD, the patient was seen in room APA02/APA02. This chart was scribed by Lewanda Rife, ED scribe. Patient's care was started at 23:03  CSN: [id number]  Arrival date & time 07/06/12  2150   First MD Initiated Contact with Patient 07/06/12 2301      Chief Complaint  Patient presents with  . Altered Mental Status    (Consider location/radiation/quality/duration/timing/severity/associated sxs/prior Treatment)  Hx was provided by the family.  Level 5 caveat applies due to dementia. HPI MARVELLE SPAN is a 79 y.o. female who presents to the Emergency Department and family reports grimacing and discomfort after bowel movement today and on Wednesday.Onset 3 months of difficulty with bowel movements.. Family reports pt has not been able to have regular bowel movements with worsening symptoms for past 3 days. Family reports pt often has a vasovagal response with bowel movements. Family reports alternating Miralax and Sorbitol every other day. Family reports pt started lactulose last week per Dr. Sherwood Gambler.  PCP Dr. Sherwood Gambler    Past Medical History  Diagnosis Date  . 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 Cervical cancer    . Abdominal hysterectomy    . Thyroid surgery      No family history on file.  History  Substance Use Topics  . Smoking status: Not on file  . Smokeless tobacco: Not on file  . Alcohol Use: No    OB History   Grav Para Term Preterm Abortions TAB SAB Ect Mult Living                  Review of Systems  Unable to perform ROS: Dementia    Allergies  Penicillins  Home Medications   Current Outpatient Rx  Name  Route  Sig  Dispense  Refill  . CRANBERRY EXTRACT PO   Oral   Take 1 capsule by mouth daily.         Marland Kitchen lactulose (CHRONULAC) 10 GM/15ML solution   Oral   Take 10 g by mouth 4 (four) times daily.         Marland Kitchen levofloxacin (LEVAQUIN) 500  MG tablet   Oral   Take 500 mg by mouth daily.         Marland Kitchen nystatin-triamcinolone (MYCOLOG II) cream   Topical   Apply 1 application topically daily as needed (for irritation).         Marland Kitchen OVER THE COUNTER MEDICATION   Oral   Take 10 mLs by mouth daily. CalMax is a high-quality, versatile, drinkable powdered supplement packaged in a 5oz container with approximately 20 servings (roughly 2 teaspoons per serving) and contains: .200 mg magnesium .400 mg of calcium  CalMax CalMax,  the perfect, all-in-one supplement solution for your bones, muscles and heart, helps manage stress and prepares your body for your best rest. CalMax  is a high-quality, versatile, drinkable supplement with Magnesium, Calcium, and Vitamin C that works in harmony to help your body with cardiovascular functions, synthesis of energy, heart rate, muscle contraction, every day stress and so much more. Check out our CalMax all-in-one supplement to see how it benefits your health!  The need for calcium and magnesium is so much more complex than just building and maintaining strong bones. An adequate intake of daily dietary calcium is required to control the heart rate, blood clotting, muscle contraction, and much more. And magnesium is an  important co-factor in over 300 enzymatic reactions in the human body, contributing to the production of cardiovascular functions, and the production and synthesis of energy. Calcium and magnesium work in harmony to fight the damage of everyday stress  .500 mg of vitamin C         . sorbitol 70 % solution   Oral   Take 30 mLs by mouth 2 (two) times daily.           BP 109/60  Pulse 87  Temp(Src) 98.6 F (37 C) (Rectal)  Resp 20  SpO2 95%  Physical Exam  Nursing note and vitals reviewed. Constitutional: No distress.  Patient is sleeping  HENT:  Head: Normocephalic and atraumatic.  Eyes: EOM are normal.  Neck: Neck supple. No tracheal deviation present.   Cardiovascular: Normal rate, regular rhythm and normal heart sounds.   No murmur heard. Pulmonary/Chest: Effort normal and breath sounds normal. No respiratory distress. She has no wheezes. She has no rales.  Abdominal: Soft. Bowel sounds are normal.  Musculoskeletal: Normal range of motion.  Skin: Skin is warm and dry.  Psychiatric: She has a normal mood and affect. Her behavior is normal.    ED Course  Procedures (including critical care time)   MDM  Demented patient cared for at home who has been having difficulty with bowel movements for 3 months. Is on a routine of sorbitol and miralax. Just added lactulose. Has had vaso vagal type episodes with the onset of bowel movements the last two times. Explained to the family the use of miralax to help promote BMs. Lactulose will cause cramping. They voiced understanding. Pt stable in ED with no significant deterioration in condition.The patient appears reasonably screened and/or stabilized for discharge and I doubt any other medical condition or other [facility] requiring further screening, evaluation, or treatment in the ED at this time prior to discharge.  I personally performed the services described in this documentation, which was scribed in my presence. The recorded information has been reviewed and considered.   MDM Reviewed: nursing note and vitals           Nicoletta Dress. Colon Branch, MD 07/06/12 2344

## 2012-07-06 NOTE — ED Notes (Signed)
Spoke to family of pt, informed them that we are waiting for physician to see the patient.

## 2012-07-06 NOTE — ED Notes (Signed)
Dr Strand in room assessing pt at this time 

## 2012-07-07 DIAGNOSIS — R279 Unspecified lack of coordination: Secondary | ICD-10-CM | POA: Diagnosis not present

## 2012-07-07 NOTE — ED Notes (Signed)
Pt resting comfortably, NAD noted, resp even and nonlabored, pts family in the room at this time. [facility] has been contacted and are on way to transport pt back home.

## 2012-07-10 DIAGNOSIS — M6281 Muscle weakness (generalized): Secondary | ICD-10-CM | POA: Diagnosis not present

## 2012-07-10 DIAGNOSIS — R55 Syncope and collapse: Secondary | ICD-10-CM | POA: Diagnosis not present

## 2012-07-10 DIAGNOSIS — I10 Essential (primary) hypertension: Secondary | ICD-10-CM | POA: Diagnosis not present

## 2012-07-10 DIAGNOSIS — G309 Alzheimer's disease, unspecified: Secondary | ICD-10-CM | POA: Diagnosis not present

## 2012-07-10 DIAGNOSIS — R633 Feeding difficulties: Secondary | ICD-10-CM | POA: Diagnosis not present

## 2012-07-10 DIAGNOSIS — Z5189 Encounter for other specified aftercare: Secondary | ICD-10-CM | POA: Diagnosis not present

## 2012-07-11 DIAGNOSIS — Z5189 Encounter for other specified aftercare: Secondary | ICD-10-CM | POA: Diagnosis not present

## 2012-07-11 DIAGNOSIS — R55 Syncope and collapse: Secondary | ICD-10-CM | POA: Diagnosis not present

## 2012-07-11 DIAGNOSIS — R633 Feeding difficulties: Secondary | ICD-10-CM | POA: Diagnosis not present

## 2012-07-11 DIAGNOSIS — M6281 Muscle weakness (generalized): Secondary | ICD-10-CM | POA: Diagnosis not present

## 2012-07-11 DIAGNOSIS — I10 Essential (primary) hypertension: Secondary | ICD-10-CM | POA: Diagnosis not present

## 2012-07-11 DIAGNOSIS — F0280 Dementia in other diseases classified elsewhere without behavioral disturbance: Secondary | ICD-10-CM | POA: Diagnosis not present

## 2012-07-18 DIAGNOSIS — M6281 Muscle weakness (generalized): Secondary | ICD-10-CM | POA: Diagnosis not present

## 2012-07-18 DIAGNOSIS — I10 Essential (primary) hypertension: Secondary | ICD-10-CM | POA: Diagnosis not present

## 2012-07-18 DIAGNOSIS — Z5189 Encounter for other specified aftercare: Secondary | ICD-10-CM | POA: Diagnosis not present

## 2012-07-18 DIAGNOSIS — R633 Feeding difficulties: Secondary | ICD-10-CM | POA: Diagnosis not present

## 2012-07-18 DIAGNOSIS — R55 Syncope and collapse: Secondary | ICD-10-CM | POA: Diagnosis not present

## 2012-07-18 DIAGNOSIS — F0280 Dementia in other diseases classified elsewhere without behavioral disturbance: Secondary | ICD-10-CM | POA: Diagnosis not present

## 2012-07-20 DIAGNOSIS — Z5189 Encounter for other specified aftercare: Secondary | ICD-10-CM | POA: Diagnosis not present

## 2012-07-20 DIAGNOSIS — R633 Feeding difficulties: Secondary | ICD-10-CM | POA: Diagnosis not present

## 2012-07-20 DIAGNOSIS — I10 Essential (primary) hypertension: Secondary | ICD-10-CM | POA: Diagnosis not present

## 2012-07-20 DIAGNOSIS — G309 Alzheimer's disease, unspecified: Secondary | ICD-10-CM | POA: Diagnosis not present

## 2012-07-20 DIAGNOSIS — M6281 Muscle weakness (generalized): Secondary | ICD-10-CM | POA: Diagnosis not present

## 2012-07-20 DIAGNOSIS — R55 Syncope and collapse: Secondary | ICD-10-CM | POA: Diagnosis not present

## 2012-07-25 DIAGNOSIS — I10 Essential (primary) hypertension: Secondary | ICD-10-CM | POA: Diagnosis not present

## 2012-07-25 DIAGNOSIS — R633 Feeding difficulties: Secondary | ICD-10-CM | POA: Diagnosis not present

## 2012-07-25 DIAGNOSIS — G309 Alzheimer's disease, unspecified: Secondary | ICD-10-CM | POA: Diagnosis not present

## 2012-07-25 DIAGNOSIS — Z5189 Encounter for other specified aftercare: Secondary | ICD-10-CM | POA: Diagnosis not present

## 2012-07-25 DIAGNOSIS — R55 Syncope and collapse: Secondary | ICD-10-CM | POA: Diagnosis not present

## 2012-07-25 DIAGNOSIS — M6281 Muscle weakness (generalized): Secondary | ICD-10-CM | POA: Diagnosis not present

## 2012-07-27 DIAGNOSIS — M6281 Muscle weakness (generalized): Secondary | ICD-10-CM | POA: Diagnosis not present

## 2012-07-27 DIAGNOSIS — Z5189 Encounter for other specified aftercare: Secondary | ICD-10-CM | POA: Diagnosis not present

## 2012-07-27 DIAGNOSIS — I10 Essential (primary) hypertension: Secondary | ICD-10-CM | POA: Diagnosis not present

## 2012-07-27 DIAGNOSIS — G309 Alzheimer's disease, unspecified: Secondary | ICD-10-CM | POA: Diagnosis not present

## 2012-07-27 DIAGNOSIS — R55 Syncope and collapse: Secondary | ICD-10-CM | POA: Diagnosis not present

## 2012-07-27 DIAGNOSIS — R633 Feeding difficulties: Secondary | ICD-10-CM | POA: Diagnosis not present

## 2012-08-01 DIAGNOSIS — R55 Syncope and collapse: Secondary | ICD-10-CM | POA: Diagnosis not present

## 2012-08-01 DIAGNOSIS — M6281 Muscle weakness (generalized): Secondary | ICD-10-CM | POA: Diagnosis not present

## 2012-08-01 DIAGNOSIS — F0280 Dementia in other diseases classified elsewhere without behavioral disturbance: Secondary | ICD-10-CM | POA: Diagnosis not present

## 2012-08-01 DIAGNOSIS — R633 Feeding difficulties: Secondary | ICD-10-CM | POA: Diagnosis not present

## 2012-08-01 DIAGNOSIS — Z5189 Encounter for other specified aftercare: Secondary | ICD-10-CM | POA: Diagnosis not present

## 2012-08-01 DIAGNOSIS — I10 Essential (primary) hypertension: Secondary | ICD-10-CM | POA: Diagnosis not present

## 2012-08-03 DIAGNOSIS — G309 Alzheimer's disease, unspecified: Secondary | ICD-10-CM | POA: Diagnosis not present

## 2012-08-03 DIAGNOSIS — R633 Feeding difficulties: Secondary | ICD-10-CM | POA: Diagnosis not present

## 2012-08-03 DIAGNOSIS — Z5189 Encounter for other specified aftercare: Secondary | ICD-10-CM | POA: Diagnosis not present

## 2012-08-03 DIAGNOSIS — R55 Syncope and collapse: Secondary | ICD-10-CM | POA: Diagnosis not present

## 2012-08-03 DIAGNOSIS — M6281 Muscle weakness (generalized): Secondary | ICD-10-CM | POA: Diagnosis not present

## 2012-08-03 DIAGNOSIS — I10 Essential (primary) hypertension: Secondary | ICD-10-CM | POA: Diagnosis not present

## 2012-08-08 DIAGNOSIS — R633 Feeding difficulties: Secondary | ICD-10-CM | POA: Diagnosis not present

## 2012-08-08 DIAGNOSIS — R55 Syncope and collapse: Secondary | ICD-10-CM | POA: Diagnosis not present

## 2012-08-08 DIAGNOSIS — M6281 Muscle weakness (generalized): Secondary | ICD-10-CM | POA: Diagnosis not present

## 2012-08-08 DIAGNOSIS — F0280 Dementia in other diseases classified elsewhere without behavioral disturbance: Secondary | ICD-10-CM | POA: Diagnosis not present

## 2012-08-08 DIAGNOSIS — I10 Essential (primary) hypertension: Secondary | ICD-10-CM | POA: Diagnosis not present

## 2012-08-08 DIAGNOSIS — Z5189 Encounter for other specified aftercare: Secondary | ICD-10-CM | POA: Diagnosis not present

## 2012-09-27 ENCOUNTER — Emergency Department (HOSPITAL_COMMUNITY)
Admission: EM | Admit: 2012-09-27 | Discharge: 2012-09-28 | Disposition: A | Discharge status: Home / Self Care | Payer: Medicare Other | Attending: Emergency Medicine | Admitting: Emergency Medicine

## 2012-09-27 ENCOUNTER — Emergency Department (HOSPITAL_COMMUNITY): Payer: Medicare Other

## 2012-09-27 ENCOUNTER — Encounter (HOSPITAL_COMMUNITY): Payer: Self-pay

## 2012-09-27 DIAGNOSIS — Z79899 Other long term (current) drug therapy: Secondary | ICD-10-CM | POA: Insufficient documentation

## 2012-09-27 DIAGNOSIS — Z880 Allergy status to penicillin: Secondary | ICD-10-CM | POA: Diagnosis not present

## 2012-09-27 DIAGNOSIS — K5289 Other specified noninfective gastroenteritis and colitis: Secondary | ICD-10-CM | POA: Diagnosis not present

## 2012-09-27 DIAGNOSIS — N390 Urinary tract infection, site not specified: Secondary | ICD-10-CM | POA: Insufficient documentation

## 2012-09-27 DIAGNOSIS — Z90710 Acquired absence of both cervix and uterus: Secondary | ICD-10-CM | POA: Diagnosis not present

## 2012-09-27 DIAGNOSIS — R197 Diarrhea, unspecified: Secondary | ICD-10-CM | POA: Diagnosis not present

## 2012-09-27 DIAGNOSIS — K529 Noninfective gastroenteritis and colitis, unspecified: Secondary | ICD-10-CM

## 2012-09-27 DIAGNOSIS — Z9889 Other specified postprocedural states: Secondary | ICD-10-CM | POA: Diagnosis not present

## 2012-09-27 DIAGNOSIS — F0280 Dementia in other diseases classified elsewhere without behavioral disturbance: Secondary | ICD-10-CM | POA: Diagnosis not present

## 2012-09-27 DIAGNOSIS — F29 Unspecified psychosis not due to a substance or known physiological condition: Secondary | ICD-10-CM | POA: Diagnosis not present

## 2012-09-27 DIAGNOSIS — Z8541 Personal history of malignant neoplasm of cervix uteri: Secondary | ICD-10-CM | POA: Diagnosis not present

## 2012-09-27 DIAGNOSIS — R4182 Altered mental status, unspecified: Secondary | ICD-10-CM | POA: Diagnosis not present

## 2012-09-27 DIAGNOSIS — R918 Other nonspecific abnormal finding of lung field: Secondary | ICD-10-CM | POA: Diagnosis not present

## 2012-09-27 DIAGNOSIS — G309 Alzheimer's disease, unspecified: Secondary | ICD-10-CM | POA: Insufficient documentation

## 2012-09-27 DIAGNOSIS — R1110 Vomiting, unspecified: Secondary | ICD-10-CM | POA: Diagnosis not present

## 2012-09-27 LAB — CBC WITH DIFFERENTIAL/PLATELET
Basophils Absolute: 0.1 10*3/uL (ref 0.0–0.1)
Basophils Relative: 1 % (ref 0–1)
Eosinophils Absolute: 0.1 10*3/uL (ref 0.0–0.7)
Eosinophils Relative: 1 % (ref 0–5)
Lymphocytes Relative: 26 % (ref 12–46)
MCV: 87.6 fL (ref 78.0–100.0)
Platelets: 274 10*3/uL (ref 150–400)
RDW: 13.5 % (ref 11.5–15.5)
WBC: 4 10*3/uL (ref 4.0–10.5)

## 2012-09-27 LAB — COMPREHENSIVE METABOLIC PANEL
ALT: 9 U/L (ref 0–35)
AST: 18 U/L (ref 0–37)
Albumin: 3.2 g/dL — ABNORMAL LOW (ref 3.5–5.2)
CO2: 29 mEq/L (ref 19–32)
Calcium: 9.5 mg/dL (ref 8.4–10.5)
Sodium: 136 mEq/L (ref 135–145)
Total Protein: 7.6 g/dL (ref 6.0–8.3)

## 2012-09-27 LAB — URINALYSIS, ROUTINE W REFLEX MICROSCOPIC
Glucose, UA: NEGATIVE mg/dL
Specific Gravity, Urine: <1.005 — ABNORMAL LOW (ref 1.005–1.030)
pH: 6 (ref 5.0–8.0)

## 2012-09-27 LAB — URINE MICROSCOPIC-ADD ON

## 2012-09-27 MED ORDER — SODIUM CHLORIDE 0.9 % IV BOLUS (SEPSIS)
500.0000 mL | Freq: Once | INTRAVENOUS | Status: AC
  Administered 2012-09-27: 500 mL via INTRAVENOUS

## 2012-09-27 MED ORDER — ONDANSETRON HCL 4 MG/5ML PO SOLN: 4.0000 mg | Freq: Three times a day (TID) | ORAL | Status: DC | PRN

## 2012-09-27 MED ORDER — ONDANSETRON HCL 4 MG/2ML IJ SOLN
4.0000 mg | Freq: Once | INTRAMUSCULAR | Status: AC
  Administered 2012-09-27: 4 mg via INTRAVENOUS
  Filled 2012-09-27: qty 2

## 2012-09-27 MED ORDER — DEXTROSE 5 % IV SOLN
1.0000 g | Freq: Once | INTRAVENOUS | Status: AC
  Administered 2012-09-27: 1 g via INTRAVENOUS
  Filled 2012-09-27: qty 10

## 2012-09-27 MED ORDER — CEPHALEXIN 250 MG/5ML PO SUSR: 250.0000 mg | Freq: Three times a day (TID) | ORAL | Status: DC

## 2012-09-27 NOTE — ED Provider Notes (Signed)
History    This chart was scribed for Diana Hutching, MD by Sofie Rower, ED Scribe. The patient was seen in room APA05/APA05 and the patient's care was started at 9:12PM.  Level 5 Caveat: Dementia.   CSN: [id number]  Arrival date & time 09/27/12  2104   First MD Initiated Contact with Patient 09/27/12 2112      Chief Complaint  Patient presents with  . Emesis    (Consider location/radiation/quality/duration/timing/severity/associated sxs/prior treatment) The history is provided by a relative. The history is limited by the condition of the patient. No language interpreter was used.    Diana Byrd is a 79 y.o. female , with a hx of dementia, cervical cancer, abdominal hysterectomy, and thyroid surgery, who presents to the Emergency Department complaining of sudden, progressively worsening, emesis (X 1 today), onset today (09/27/12). The pt's daughter reports the pt ate clam chowder for dinner earlier this evening, 09/27/12, immediately after which she experienced an episode of emesis. Furthermore, the pt's daughter informs the pt has been acting slightly more confused than her baseline, which has prompted their desire to seek medical evaluation at [facility] this evening (09/27/12).  The pt does not smoke or drink alcohol.   PCP is Dr. Sherwood Gambler.    Past Medical History  Diagnosis Date  . 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 Cervical cancer    . Abdominal hysterectomy    . Thyroid surgery      No family history on file.  History  Substance Use Topics  . Smoking status: Not on file  . Smokeless tobacco: Not on file  . Alcohol Use: No    OB History   Grav Para Term Preterm Abortions TAB SAB Ect Mult Living                  Review of Systems  Unable to perform ROS: Dementia    Allergies  Penicillins  Home Medications   Current Outpatient Rx  Name  Route  Sig  Dispense  Refill  . CRANBERRY EXTRACT PO    Oral   Take 1 capsule by mouth daily.         Marland Kitchen lactulose (CHRONULAC) 10 GM/15ML solution   Oral   Take 10 g by mouth 4 (four) times daily.         Marland Kitchen levofloxacin (LEVAQUIN) 500 MG tablet   Oral   Take 500 mg by mouth daily.         Marland Kitchen nystatin-triamcinolone (MYCOLOG II) cream   Topical   Apply 1 application topically daily as needed (for irritation).         Marland Kitchen OVER THE COUNTER MEDICATION   Oral   Take 10 mLs by mouth daily. CalMax is a high-quality, versatile, drinkable powdered supplement packaged in a 5oz container with approximately 20 servings (roughly 2 teaspoons per serving) and contains: .200 mg magnesium .400 mg of calcium  CalMax CalMax,  the perfect, all-in-one supplement solution for your bones, muscles and heart, helps manage stress and prepares your body for your best rest. CalMax  is a high-quality, versatile, drinkable supplement with Magnesium, Calcium, and Vitamin C that works in harmony to help your body with cardiovascular functions, synthesis of energy, heart rate, muscle contraction, every day stress and so much more. Check out our CalMax all-in-one supplement to see how it benefits your health!  The need for calcium and magnesium is so much  more complex than just building and maintaining strong bones. An adequate intake of daily dietary calcium is required to control the heart rate, blood clotting, muscle contraction, and much more. And magnesium is an important co-factor in over 300 enzymatic reactions in the human body, contributing to the production of cardiovascular functions, and the production and synthesis of energy. Calcium and magnesium work in harmony to fight the damage of everyday stress  .500 mg of vitamin C         . sorbitol 70 % solution   Oral   Take 30 mLs by mouth 2 (two) times daily.           BP 124/96  Pulse 122  Temp(Src) 99.2 F (37.3 C) (Oral)  Resp 20  SpO2 95%  Physical Exam  Nursing note and vitals  reviewed. Constitutional: She appears well-developed and well-nourished.  Pt is restless during the exam.   HENT:  Head: Normocephalic and atraumatic.  Crusted blood in the left nare.   Eyes: Conjunctivae and EOM are normal. Pupils are equal, round, and reactive to light.  Neck: Normal range of motion. Neck supple.  Cardiovascular: Normal rate, regular rhythm and normal heart sounds.   Pulmonary/Chest: Effort normal and breath sounds normal.  Abdominal: Soft. Bowel sounds are normal. She exhibits no distension.  Musculoskeletal:  Unable.   Neurological:  Unable.   Skin: Skin is warm and dry.  Psychiatric:  Demented.     ED Course  Procedures (including critical care time)  DIAGNOSTIC STUDIES: Oxygen Saturation is 95% on room air, normal by my interpretation.    COORDINATION OF CARE:  9:33 PM- Treatment plan concerning nausea management and discussed with patients daughters. Pt's daughters agree with treatment.     Results for orders placed during the hospital encounter of 09/27/12  CBC WITH DIFFERENTIAL      Result Value Range   WBC 4.0  4.0 - 10.5 K/uL   RBC 4.03  3.87 - 5.11 MIL/uL   Hemoglobin 11.5 (*) 12.0 - 15.0 g/dL   HCT 16.1 (*) 09.6 - 04.5 %   MCV 87.6  78.0 - 100.0 fL   MCH 28.5  26.0 - 34.0 pg   MCHC 32.6  30.0 - 36.0 g/dL   RDW 40.9  81.1 - 91.4 %   Platelets 274  150 - 400 K/uL   Neutrophils Relative % 65  43 - 77 %   Neutro Abs 2.6  1.7 - 7.7 K/uL   Lymphocytes Relative 26  12 - 46 %   Lymphs Abs 1.0  0.7 - 4.0 K/uL   Monocytes Relative 7  3 - 12 %   Monocytes Absolute 0.3  0.1 - 1.0 K/uL   Eosinophils Relative 1  0 - 5 %   Eosinophils Absolute 0.1  0.0 - 0.7 K/uL   Basophils Relative 1  0 - 1 %   Basophils Absolute 0.1  0.0 - 0.1 K/uL  COMPREHENSIVE METABOLIC PANEL      Result Value Range   Sodium 136  135 - 145 mEq/L   Potassium 3.5  3.5 - 5.1 mEq/L   Chloride 100  96 - 112 mEq/L   CO2 29  19 - 32 mEq/L   Glucose, Bld 114 (*) 70 - 99 mg/dL    BUN 9  6 - 23 mg/dL   Creatinine, Ser 7.82  0.50 - 1.10 mg/dL   Calcium 9.5  8.4 - 95.6 mg/dL   Total Protein 7.6  6.0 - 8.3  g/dL   Albumin 3.2 (*) 3.5 - 5.2 g/dL   AST 18  0 - 37 U/L   ALT 9  0 - 35 U/L   Alkaline Phosphatase 82  39 - 117 U/L   Total Bilirubin 0.2 (*) 0.3 - 1.2 mg/dL   GFR calc non Af Amer 76 (*) >90 mL/min   GFR calc Af Amer 88 (*) >90 mL/min  LIPASE, BLOOD      Result Value Range   Lipase 34  11 - 59 U/L   Ct Abdomen Pelvis Wo Contrast  09/27/2012   *RADIOLOGY REPORT*  Clinical Data: Vomiting.  CT ABDOMEN AND PELVIS WITHOUT CONTRAST  Technique:  Multidetector CT imaging of the abdomen and pelvis was performed following the standard protocol without intravenous contrast.  Comparison: 06/21/2012.  Findings: Examination is limited by motion.  Stable left basilar scarring changes.  Stable mild cardiac enlargement.  The liver is grossly normal and stable.  The spleen is normal. The pancreas, adrenal glands and kidneys are grossly normal.  The stomach, duodenum, small bowel and colon grossly normal without oral contrast and with significant motion artifact.  There are fluid-filled small bowel loops and some fluid in the colon which could suggest gastroenteritis.  The aorta demonstrates moderate atherosclerotic calcifications but no focal aneurysm.  No mesenteric or retroperitoneal mass or adenopathy.  The bladder appears normal.  The uterus is surgically absent.  No pelvic mass or adenopathy.  The bony structures are grossly normal.  IMPRESSION:  1.  Limited examination due to patient motion and lack of IV and oral contrast. 2.  Grossly normal CT scan without obvious acute abdominal/pelvic findings. 3.  Possible gastroenteritis or ileus.   Original Report Authenticated By: Rudie Meyer, M.D.   Dg Chest 1 View  09/27/2012   *RADIOLOGY REPORT*  Clinical Data: Aspiration.  CHEST - 1 VIEW  Comparison: 04/22/2012.  Findings: The heart is enlarged but stable.  The lungs are grossly  clear.  Stable surgical changes with median sternotomy wires and significant scoliosis.  IMPRESSION: Stable cardiac enlargement.  No definite acute pulmonary findings.   Original Report Authenticated By: Rudie Meyer, M.D.      No diagnosis found.    MDM  Patient feels much better after IV hydration, IV Zofran. Urinalysis shows evidence of infection. CT scan A/P negative acute.  Discharge meds Zofran suspension and Keflex suspension.  Urine culture pending      I personally performed the services described in this documentation, which was scribed in my presence. The recorded information has been reviewed and is accurate.    Diana Hutching, MD 09/27/12 [phone]

## 2012-09-27 NOTE — ED Notes (Signed)
Pt with onset x 1 hour of vomiting and diarrhea

## 2012-09-28 DIAGNOSIS — R279 Unspecified lack of coordination: Secondary | ICD-10-CM | POA: Diagnosis not present

## 2012-09-30 LAB — URINE CULTURE: Colony Count: 65000

## 2012-10-01 ENCOUNTER — Telehealth (HOSPITAL_COMMUNITY): Payer: Self-pay | Admitting: Emergency Medicine

## 2012-10-01 NOTE — Telephone Encounter (Signed)
Post ED Visit - Positive Culture Follow-up  Culture report reviewed by antimicrobial stewardship pharmacist: []  Wes Dulaney, Pharm.D., BCPS []  Celedonio Miyamoto, [address].D., BCPS []  Georgina Pillion, Pharm.D., BCPS []  [place], [address].D., BCPS, AAHIVP []  Estella Husk, Pharm.D., BCPS, AAHIV  Culture review by Laurence Slate Pharm D  Positive urine culture Treated with cephalexin, organism sensitive to the same and no further patient follow-up is required at this time.  Ashley Jacobs 10/01/2012, 9:20 AM

## 2013-04-15 DIAGNOSIS — Z681 Body mass index (BMI) 19 or less, adult: Secondary | ICD-10-CM | POA: Diagnosis not present

## 2013-04-15 DIAGNOSIS — J310 Chronic rhinitis: Secondary | ICD-10-CM | POA: Diagnosis not present

## 2013-04-15 DIAGNOSIS — J019 Acute sinusitis, unspecified: Secondary | ICD-10-CM | POA: Diagnosis not present

## 2014-07-24 DIAGNOSIS — G309 Alzheimer's disease, unspecified: Secondary | ICD-10-CM | POA: Diagnosis not present

## 2014-07-24 DIAGNOSIS — L89302 Pressure ulcer of unspecified buttock, stage 2: Secondary | ICD-10-CM | POA: Diagnosis not present

## 2014-07-24 DIAGNOSIS — I10 Essential (primary) hypertension: Secondary | ICD-10-CM | POA: Diagnosis not present

## 2014-07-24 DIAGNOSIS — Z681 Body mass index (BMI) 19 or less, adult: Secondary | ICD-10-CM | POA: Diagnosis not present

## 2015-12-16 DIAGNOSIS — Z1389 Encounter for screening for other disorder: Secondary | ICD-10-CM | POA: Diagnosis not present

## 2015-12-16 DIAGNOSIS — Z681 Body mass index (BMI) 19 or less, adult: Secondary | ICD-10-CM | POA: Diagnosis not present

## 2015-12-16 DIAGNOSIS — Z13220 Encounter for screening for lipoid disorders: Secondary | ICD-10-CM | POA: Diagnosis not present

## 2015-12-16 DIAGNOSIS — F0280 Dementia in other diseases classified elsewhere without behavioral disturbance: Secondary | ICD-10-CM | POA: Diagnosis not present

## 2015-12-16 DIAGNOSIS — Z0001 Encounter for general adult medical examination with abnormal findings: Secondary | ICD-10-CM | POA: Diagnosis not present

## 2015-12-16 DIAGNOSIS — E063 Autoimmune thyroiditis: Secondary | ICD-10-CM | POA: Diagnosis not present

## 2015-12-16 DIAGNOSIS — D519 Vitamin B12 deficiency anemia, unspecified: Secondary | ICD-10-CM | POA: Diagnosis not present

## 2015-12-16 DIAGNOSIS — E039 Hypothyroidism, unspecified: Secondary | ICD-10-CM | POA: Diagnosis not present

## 2016-01-18 DIAGNOSIS — Z9181 History of falling: Secondary | ICD-10-CM | POA: Diagnosis not present

## 2016-01-18 DIAGNOSIS — I10 Essential (primary) hypertension: Secondary | ICD-10-CM | POA: Diagnosis not present

## 2016-01-18 DIAGNOSIS — G309 Alzheimer's disease, unspecified: Secondary | ICD-10-CM | POA: Diagnosis not present

## 2016-01-18 DIAGNOSIS — Z8673 Personal history of transient ischemic attack (TIA), and cerebral infarction without residual deficits: Secondary | ICD-10-CM | POA: Diagnosis not present

## 2016-01-18 DIAGNOSIS — F0280 Dementia in other diseases classified elsewhere without behavioral disturbance: Secondary | ICD-10-CM | POA: Diagnosis not present

## 2016-01-18 DIAGNOSIS — M6281 Muscle weakness (generalized): Secondary | ICD-10-CM | POA: Diagnosis not present

## 2016-01-25 DIAGNOSIS — Z8673 Personal history of transient ischemic attack (TIA), and cerebral infarction without residual deficits: Secondary | ICD-10-CM | POA: Diagnosis not present

## 2016-01-25 DIAGNOSIS — Z9181 History of falling: Secondary | ICD-10-CM | POA: Diagnosis not present

## 2016-01-25 DIAGNOSIS — G309 Alzheimer's disease, unspecified: Secondary | ICD-10-CM | POA: Diagnosis not present

## 2016-01-25 DIAGNOSIS — I10 Essential (primary) hypertension: Secondary | ICD-10-CM | POA: Diagnosis not present

## 2016-01-25 DIAGNOSIS — M6281 Muscle weakness (generalized): Secondary | ICD-10-CM | POA: Diagnosis not present

## 2016-01-25 DIAGNOSIS — F0280 Dementia in other diseases classified elsewhere without behavioral disturbance: Secondary | ICD-10-CM | POA: Diagnosis not present

## 2016-01-29 DIAGNOSIS — M6281 Muscle weakness (generalized): Secondary | ICD-10-CM | POA: Diagnosis not present

## 2016-01-29 DIAGNOSIS — Z8673 Personal history of transient ischemic attack (TIA), and cerebral infarction without residual deficits: Secondary | ICD-10-CM | POA: Diagnosis not present

## 2016-01-29 DIAGNOSIS — G309 Alzheimer's disease, unspecified: Secondary | ICD-10-CM | POA: Diagnosis not present

## 2016-01-29 DIAGNOSIS — I10 Essential (primary) hypertension: Secondary | ICD-10-CM | POA: Diagnosis not present

## 2016-01-29 DIAGNOSIS — Z9181 History of falling: Secondary | ICD-10-CM | POA: Diagnosis not present

## 2016-01-29 DIAGNOSIS — F0280 Dementia in other diseases classified elsewhere without behavioral disturbance: Secondary | ICD-10-CM | POA: Diagnosis not present

## 2016-02-01 DIAGNOSIS — I10 Essential (primary) hypertension: Secondary | ICD-10-CM | POA: Diagnosis not present

## 2016-02-01 DIAGNOSIS — Z9181 History of falling: Secondary | ICD-10-CM | POA: Diagnosis not present

## 2016-02-01 DIAGNOSIS — M6281 Muscle weakness (generalized): Secondary | ICD-10-CM | POA: Diagnosis not present

## 2016-02-01 DIAGNOSIS — F0280 Dementia in other diseases classified elsewhere without behavioral disturbance: Secondary | ICD-10-CM | POA: Diagnosis not present

## 2016-02-01 DIAGNOSIS — Z8673 Personal history of transient ischemic attack (TIA), and cerebral infarction without residual deficits: Secondary | ICD-10-CM | POA: Diagnosis not present

## 2016-02-01 DIAGNOSIS — G309 Alzheimer's disease, unspecified: Secondary | ICD-10-CM | POA: Diagnosis not present

## 2016-02-04 DIAGNOSIS — G309 Alzheimer's disease, unspecified: Secondary | ICD-10-CM | POA: Diagnosis not present

## 2016-02-04 DIAGNOSIS — I10 Essential (primary) hypertension: Secondary | ICD-10-CM | POA: Diagnosis not present

## 2016-02-04 DIAGNOSIS — Z8673 Personal history of transient ischemic attack (TIA), and cerebral infarction without residual deficits: Secondary | ICD-10-CM | POA: Diagnosis not present

## 2016-02-04 DIAGNOSIS — F0280 Dementia in other diseases classified elsewhere without behavioral disturbance: Secondary | ICD-10-CM | POA: Diagnosis not present

## 2016-02-04 DIAGNOSIS — M6281 Muscle weakness (generalized): Secondary | ICD-10-CM | POA: Diagnosis not present

## 2016-02-04 DIAGNOSIS — Z9181 History of falling: Secondary | ICD-10-CM | POA: Diagnosis not present

## 2016-02-05 DIAGNOSIS — Z8673 Personal history of transient ischemic attack (TIA), and cerebral infarction without residual deficits: Secondary | ICD-10-CM | POA: Diagnosis not present

## 2016-02-05 DIAGNOSIS — I10 Essential (primary) hypertension: Secondary | ICD-10-CM | POA: Diagnosis not present

## 2016-02-05 DIAGNOSIS — M6281 Muscle weakness (generalized): Secondary | ICD-10-CM | POA: Diagnosis not present

## 2016-02-05 DIAGNOSIS — G309 Alzheimer's disease, unspecified: Secondary | ICD-10-CM | POA: Diagnosis not present

## 2016-02-05 DIAGNOSIS — Z9181 History of falling: Secondary | ICD-10-CM | POA: Diagnosis not present

## 2016-02-05 DIAGNOSIS — F0280 Dementia in other diseases classified elsewhere without behavioral disturbance: Secondary | ICD-10-CM | POA: Diagnosis not present

## 2016-02-08 DIAGNOSIS — G309 Alzheimer's disease, unspecified: Secondary | ICD-10-CM | POA: Diagnosis not present

## 2016-02-08 DIAGNOSIS — Z8673 Personal history of transient ischemic attack (TIA), and cerebral infarction without residual deficits: Secondary | ICD-10-CM | POA: Diagnosis not present

## 2016-02-08 DIAGNOSIS — M6281 Muscle weakness (generalized): Secondary | ICD-10-CM | POA: Diagnosis not present

## 2016-02-08 DIAGNOSIS — Z9181 History of falling: Secondary | ICD-10-CM | POA: Diagnosis not present

## 2016-02-08 DIAGNOSIS — I10 Essential (primary) hypertension: Secondary | ICD-10-CM | POA: Diagnosis not present

## 2016-02-08 DIAGNOSIS — F0280 Dementia in other diseases classified elsewhere without behavioral disturbance: Secondary | ICD-10-CM | POA: Diagnosis not present

## 2016-02-10 DIAGNOSIS — M6281 Muscle weakness (generalized): Secondary | ICD-10-CM | POA: Diagnosis not present

## 2016-02-10 DIAGNOSIS — G309 Alzheimer's disease, unspecified: Secondary | ICD-10-CM | POA: Diagnosis not present

## 2016-02-10 DIAGNOSIS — Z9181 History of falling: Secondary | ICD-10-CM | POA: Diagnosis not present

## 2016-02-10 DIAGNOSIS — Z8673 Personal history of transient ischemic attack (TIA), and cerebral infarction without residual deficits: Secondary | ICD-10-CM | POA: Diagnosis not present

## 2016-02-10 DIAGNOSIS — I10 Essential (primary) hypertension: Secondary | ICD-10-CM | POA: Diagnosis not present

## 2016-02-10 DIAGNOSIS — F0280 Dementia in other diseases classified elsewhere without behavioral disturbance: Secondary | ICD-10-CM | POA: Diagnosis not present

## 2016-02-12 DIAGNOSIS — Z8673 Personal history of transient ischemic attack (TIA), and cerebral infarction without residual deficits: Secondary | ICD-10-CM | POA: Diagnosis not present

## 2016-02-12 DIAGNOSIS — I10 Essential (primary) hypertension: Secondary | ICD-10-CM | POA: Diagnosis not present

## 2016-02-12 DIAGNOSIS — F0280 Dementia in other diseases classified elsewhere without behavioral disturbance: Secondary | ICD-10-CM | POA: Diagnosis not present

## 2016-02-12 DIAGNOSIS — Z9181 History of falling: Secondary | ICD-10-CM | POA: Diagnosis not present

## 2016-02-12 DIAGNOSIS — M6281 Muscle weakness (generalized): Secondary | ICD-10-CM | POA: Diagnosis not present

## 2016-02-12 DIAGNOSIS — G309 Alzheimer's disease, unspecified: Secondary | ICD-10-CM | POA: Diagnosis not present

## 2016-02-15 DIAGNOSIS — F0280 Dementia in other diseases classified elsewhere without behavioral disturbance: Secondary | ICD-10-CM | POA: Diagnosis not present

## 2016-02-15 DIAGNOSIS — M6281 Muscle weakness (generalized): Secondary | ICD-10-CM | POA: Diagnosis not present

## 2016-02-15 DIAGNOSIS — G309 Alzheimer's disease, unspecified: Secondary | ICD-10-CM | POA: Diagnosis not present

## 2016-02-15 DIAGNOSIS — I10 Essential (primary) hypertension: Secondary | ICD-10-CM | POA: Diagnosis not present

## 2016-02-15 DIAGNOSIS — Z8673 Personal history of transient ischemic attack (TIA), and cerebral infarction without residual deficits: Secondary | ICD-10-CM | POA: Diagnosis not present

## 2016-02-15 DIAGNOSIS — Z9181 History of falling: Secondary | ICD-10-CM | POA: Diagnosis not present

## 2016-02-17 DIAGNOSIS — Z9181 History of falling: Secondary | ICD-10-CM | POA: Diagnosis not present

## 2016-02-17 DIAGNOSIS — M6281 Muscle weakness (generalized): Secondary | ICD-10-CM | POA: Diagnosis not present

## 2016-02-17 DIAGNOSIS — F0280 Dementia in other diseases classified elsewhere without behavioral disturbance: Secondary | ICD-10-CM | POA: Diagnosis not present

## 2016-02-17 DIAGNOSIS — G309 Alzheimer's disease, unspecified: Secondary | ICD-10-CM | POA: Diagnosis not present

## 2016-02-17 DIAGNOSIS — I10 Essential (primary) hypertension: Secondary | ICD-10-CM | POA: Diagnosis not present

## 2016-02-17 DIAGNOSIS — Z8673 Personal history of transient ischemic attack (TIA), and cerebral infarction without residual deficits: Secondary | ICD-10-CM | POA: Diagnosis not present

## 2016-02-18 DIAGNOSIS — M6281 Muscle weakness (generalized): Secondary | ICD-10-CM | POA: Diagnosis not present

## 2016-02-18 DIAGNOSIS — F0280 Dementia in other diseases classified elsewhere without behavioral disturbance: Secondary | ICD-10-CM | POA: Diagnosis not present

## 2016-02-18 DIAGNOSIS — I10 Essential (primary) hypertension: Secondary | ICD-10-CM | POA: Diagnosis not present

## 2016-02-18 DIAGNOSIS — G309 Alzheimer's disease, unspecified: Secondary | ICD-10-CM | POA: Diagnosis not present

## 2016-02-18 DIAGNOSIS — Z8673 Personal history of transient ischemic attack (TIA), and cerebral infarction without residual deficits: Secondary | ICD-10-CM | POA: Diagnosis not present

## 2016-02-18 DIAGNOSIS — Z9181 History of falling: Secondary | ICD-10-CM | POA: Diagnosis not present

## 2016-02-24 ENCOUNTER — Other Ambulatory Visit (HOSPITAL_COMMUNITY): Payer: Self-pay | Admitting: Internal Medicine

## 2016-02-24 DIAGNOSIS — Z8673 Personal history of transient ischemic attack (TIA), and cerebral infarction without residual deficits: Secondary | ICD-10-CM | POA: Diagnosis not present

## 2016-02-24 DIAGNOSIS — M6281 Muscle weakness (generalized): Secondary | ICD-10-CM | POA: Diagnosis not present

## 2016-02-24 DIAGNOSIS — G309 Alzheimer's disease, unspecified: Secondary | ICD-10-CM | POA: Diagnosis not present

## 2016-02-24 DIAGNOSIS — R1310 Dysphagia, unspecified: Secondary | ICD-10-CM

## 2016-02-24 DIAGNOSIS — F0280 Dementia in other diseases classified elsewhere without behavioral disturbance: Secondary | ICD-10-CM | POA: Diagnosis not present

## 2016-02-24 DIAGNOSIS — I10 Essential (primary) hypertension: Secondary | ICD-10-CM | POA: Diagnosis not present

## 2016-02-24 DIAGNOSIS — Z9181 History of falling: Secondary | ICD-10-CM | POA: Diagnosis not present

## 2016-02-25 DIAGNOSIS — M6281 Muscle weakness (generalized): Secondary | ICD-10-CM | POA: Diagnosis not present

## 2016-02-25 DIAGNOSIS — Z9181 History of falling: Secondary | ICD-10-CM | POA: Diagnosis not present

## 2016-02-25 DIAGNOSIS — I10 Essential (primary) hypertension: Secondary | ICD-10-CM | POA: Diagnosis not present

## 2016-02-25 DIAGNOSIS — Z8673 Personal history of transient ischemic attack (TIA), and cerebral infarction without residual deficits: Secondary | ICD-10-CM | POA: Diagnosis not present

## 2016-02-25 DIAGNOSIS — F0280 Dementia in other diseases classified elsewhere without behavioral disturbance: Secondary | ICD-10-CM | POA: Diagnosis not present

## 2016-02-25 DIAGNOSIS — G309 Alzheimer's disease, unspecified: Secondary | ICD-10-CM | POA: Diagnosis not present

## 2016-02-29 DIAGNOSIS — Z8673 Personal history of transient ischemic attack (TIA), and cerebral infarction without residual deficits: Secondary | ICD-10-CM | POA: Diagnosis not present

## 2016-02-29 DIAGNOSIS — I10 Essential (primary) hypertension: Secondary | ICD-10-CM | POA: Diagnosis not present

## 2016-02-29 DIAGNOSIS — F0280 Dementia in other diseases classified elsewhere without behavioral disturbance: Secondary | ICD-10-CM | POA: Diagnosis not present

## 2016-02-29 DIAGNOSIS — G309 Alzheimer's disease, unspecified: Secondary | ICD-10-CM | POA: Diagnosis not present

## 2016-02-29 DIAGNOSIS — M6281 Muscle weakness (generalized): Secondary | ICD-10-CM | POA: Diagnosis not present

## 2016-02-29 DIAGNOSIS — Z9181 History of falling: Secondary | ICD-10-CM | POA: Diagnosis not present

## 2016-03-01 ENCOUNTER — Ambulatory Visit (HOSPITAL_COMMUNITY): Payer: Medicare Other | Attending: Internal Medicine | Admitting: Speech Pathology

## 2016-03-01 ENCOUNTER — Ambulatory Visit (HOSPITAL_COMMUNITY)
Admission: RE | Admit: 2016-03-01 | Discharge: 2016-03-01 | Disposition: A | Discharge status: Home / Self Care | Payer: Medicare Other | Source: Ambulatory Visit | Attending: Internal Medicine | Admitting: Internal Medicine

## 2016-03-01 ENCOUNTER — Encounter (HOSPITAL_COMMUNITY): Payer: Self-pay | Admitting: Speech Pathology

## 2016-03-01 DIAGNOSIS — R1310 Dysphagia, unspecified: Secondary | ICD-10-CM | POA: Diagnosis not present

## 2016-03-01 DIAGNOSIS — Z23 Encounter for immunization: Secondary | ICD-10-CM | POA: Diagnosis not present

## 2016-03-01 DIAGNOSIS — Z9181 History of falling: Secondary | ICD-10-CM | POA: Diagnosis not present

## 2016-03-01 DIAGNOSIS — G309 Alzheimer's disease, unspecified: Secondary | ICD-10-CM | POA: Diagnosis not present

## 2016-03-01 DIAGNOSIS — F0280 Dementia in other diseases classified elsewhere without behavioral disturbance: Secondary | ICD-10-CM | POA: Insufficient documentation

## 2016-03-01 DIAGNOSIS — R1312 Dysphagia, oropharyngeal phase: Secondary | ICD-10-CM | POA: Diagnosis not present

## 2016-03-01 DIAGNOSIS — Z8673 Personal history of transient ischemic attack (TIA), and cerebral infarction without residual deficits: Secondary | ICD-10-CM | POA: Diagnosis not present

## 2016-03-01 DIAGNOSIS — G301 Alzheimer's disease with late onset: Secondary | ICD-10-CM | POA: Diagnosis not present

## 2016-03-01 DIAGNOSIS — I10 Essential (primary) hypertension: Secondary | ICD-10-CM | POA: Diagnosis not present

## 2016-03-01 DIAGNOSIS — M6281 Muscle weakness (generalized): Secondary | ICD-10-CM | POA: Diagnosis not present

## 2016-03-01 DIAGNOSIS — D510 Vitamin B12 deficiency anemia due to intrinsic factor deficiency: Secondary | ICD-10-CM | POA: Diagnosis not present

## 2016-03-01 NOTE — Therapy (Signed)
[place], [place], [address] Phone: [phone]   Fax:  [phone]  Modified Barium Swallow  Patient Details  Name: Diana Byrd MRN: [id number] Date of Birth: July 28, 1923 No Data Recorded  Encounter Date: 03/01/2016      End of Session - 03/01/16 2044    Visit Number 1   Number of Visits 1   Authorization Type Medicare   SLP Start Time 1320   SLP Stop Time  [id number]   SLP Time Calculation (min) 32 min   Activity Tolerance Other (comment)  limited study due to cognitive status      Past Medical History:  Diagnosis Date  . 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 ABDOMINAL HYSTERECTOMY    . cervical cancer    . THYROID SURGERY      There were no vitals filed for this visit.      Subjective Assessment - 03/01/16 1909    Subjective "We were worried she might be silently aspirating."   Patient is accompained by: Family member   Special Tests MBSS   Currently in Pain? No/denies             General - 03/01/16 1909      General Information   HPI Ms. Diana Byrd is a 82 year old woman who was referred by Dr. Gerarda Fraction for MBSS due to family concerns that pt may be silently aspirating.    Type of Study MBS-Modified Barium Swallow Study   Previous Swallow Assessment None on record, but recently seen by home health SLP per family report   Diet Prior to this Study Dysphagia 1 (puree);Thin liquids   Temperature Spikes Noted No   Respiratory Status Room air   History of Recent Intubation No   Behavior/Cognition Requires cueing;Doesn't follow directions   Oral Care Completed by SLP No   Oral Cavity - Dentition Edentulous   Vision Impaired for self feeding   Self-Feeding Abilities Total assist   Patient Positioning Upright in chair   Baseline Vocal Quality Aphonic   Volitional Cough Cognitively unable to elicit   Volitional Swallow  Unable to elicit   Anatomy Within functional limits   Pharyngeal Secretions Not observed secondary MBS            Oral Preparation/Oral Phase - 03/01/16 1911      Oral Preparation/Oral Phase   Oral Phase Impaired     Oral - Thin   Oral - Thin Teaspoon Weak ligual manipulation;Delayed A-P transit;Absent A-P transit;Oral residue   Oral - Thin Cup Weak ligual manipulation;Absent A-P transit     Oral - Solids   Oral - Puree Weak ligual manipulation;Lingual pumping;Delayed A-P transit;Oral residue;Piecemeal swallowing          Pharyngeal Phase - 03/01/16 2042      Pharyngeal Phase   Pharyngeal Phase Impaired     Pharyngeal - Thin   Pharyngeal- Thin Teaspoon Delayed swallow initiation;Swallow initiation at vallecula     Pharyngeal - Solids   Pharyngeal- Puree Swallow initiation at vallecula;Delayed swallow initiation     Electrical Stimulation - Pharyngeal Phase   Was Electrical Stimulation Used No          Cricopharyngeal Phase - 03/01/16 2043      Cervical Esophageal Phase   Cervical Esophageal Phase Impaired     Cervical Esophageal Phase - Solids   Puree Esophageal backflow into  cervical esophagus           Plan - 03-18-16 2045    Clinical Impression Statement Pt seen for MBSS, however study was extremely limited due to decreased mental status. Pt required total/max cues to accept oral bolus and swallow. She was only presented with one bite of puree (which took over 4 minutes to trigger a swallow) and several trials of thin liquid were attempted via cup and teaspoon presentation, but only one successful swallow elicted with thins. Pt with severe cognitive based oral phase dysphagia with mild pharyngeal phase on limited assessment. Severe oral phase deficits marked by poor bolus awareness, decreased lingual movement (lingual pumping), inefficient anterior posterior transit resulting in severe delays. SLP provided max multimodality cues to swallow. Once bolus  filled valleculae, swallow was triggered and no pharyngeal residue noted after the swallow (mild/mod oral residue). Similar performance with thins. Pt demonstrated anterior bolus loss and pooling in anterior sulcus, but eventually swallow one teaspoon of thin liquid. Once liquid began to spill over epiglottis, swallow triggered and no penetration/aspiration or residuals noted. Recommend home health SLP to continue education regarding swallowing in patients with advanced dementia. Would encourage feeding puree and thin as long as pt accepts and reinforce good oral hygiene to minimize risk of developing aspiration PNA. Per family, the pt has not had any recent bouts of PNA, fever, or chest congestion, but does occasionally "get strangled" when eating/drinking. Treating SLP could do trials of nectar to see if pt accepts and seems to tolerate better clinically, however pt tolerated teaspoon thin this date. Study extremely limited due to pt's mentation.    Treatment/Interventions Patient/family education   Consulted and Agree with Plan of Care Family member/caregiver   Family Member Consulted Daughter, Diana Byrd      Patient will benefit from skilled therapeutic intervention in order to improve the following deficits and impairments:   Dysphagia, oropharyngeal phase      G-Codes - 03/18/2016 1911    Functional Assessment Tool Used MBSS   Functional Limitations Swallowing   Swallow Current Status [id number]) At least 60 percent but less than 80 percent impaired, limited or restricted   Swallow Goal Status [id number]) At least 60 percent but less than 80 percent impaired, limited or restricted   Swallow Discharge Status [phone]) At least 60 percent but less than 80 percent impaired, limited or restricted          Recommendations/Treatment - 03-18-2016 2043      Swallow Evaluation Recommendations   SLP Diet Recommendations Thin;Dysphagia 1 (puree)   Liquid Administration via Spoon;Cup   Medication Administration  Crushed with puree   Supervision Full assist for feeding;Full supervision/cueing for compensatory strategies   Compensations Slow rate;Small sips/bites;Monitor for anterior loss   Postural Changes Seated upright at 90 degrees;Remain upright for at least 30 minutes after feeds/meals          Prognosis - 03/18/2016 2044      Prognosis   Prognosis for Safe Diet Advancement Guarded   Barriers to Reach Goals Cognitive deficits     Individuals Consulted   Consulted and Agree with Results and Recommendations Patient   Report Sent to  Referring physician      Problem List There are no active problems to display for this patient.  Thank you,  Genene Churn, [place]  [facility] 03-18-2016, 8:49 PM  [facility] [address] [place], [place], [address] Phone: [phone]   Fax:  [phone]  Name: Jacques  CHERYSE ORIORDAN MRN: [id number] Date of Birth: 03/01/1924

## 2016-03-02 DIAGNOSIS — Z9181 History of falling: Secondary | ICD-10-CM | POA: Diagnosis not present

## 2016-03-02 DIAGNOSIS — I10 Essential (primary) hypertension: Secondary | ICD-10-CM | POA: Diagnosis not present

## 2016-03-02 DIAGNOSIS — F0280 Dementia in other diseases classified elsewhere without behavioral disturbance: Secondary | ICD-10-CM | POA: Diagnosis not present

## 2016-03-02 DIAGNOSIS — Z8673 Personal history of transient ischemic attack (TIA), and cerebral infarction without residual deficits: Secondary | ICD-10-CM | POA: Diagnosis not present

## 2016-03-02 DIAGNOSIS — M6281 Muscle weakness (generalized): Secondary | ICD-10-CM | POA: Diagnosis not present

## 2016-03-02 DIAGNOSIS — G309 Alzheimer's disease, unspecified: Secondary | ICD-10-CM | POA: Diagnosis not present

## 2016-03-03 DIAGNOSIS — Z9181 History of falling: Secondary | ICD-10-CM | POA: Diagnosis not present

## 2016-03-03 DIAGNOSIS — F0280 Dementia in other diseases classified elsewhere without behavioral disturbance: Secondary | ICD-10-CM | POA: Diagnosis not present

## 2016-03-03 DIAGNOSIS — I10 Essential (primary) hypertension: Secondary | ICD-10-CM | POA: Diagnosis not present

## 2016-03-03 DIAGNOSIS — M6281 Muscle weakness (generalized): Secondary | ICD-10-CM | POA: Diagnosis not present

## 2016-03-03 DIAGNOSIS — G309 Alzheimer's disease, unspecified: Secondary | ICD-10-CM | POA: Diagnosis not present

## 2016-03-03 DIAGNOSIS — Z8673 Personal history of transient ischemic attack (TIA), and cerebral infarction without residual deficits: Secondary | ICD-10-CM | POA: Diagnosis not present

## 2016-03-15 DIAGNOSIS — G309 Alzheimer's disease, unspecified: Secondary | ICD-10-CM | POA: Diagnosis not present

## 2016-03-15 DIAGNOSIS — Z9181 History of falling: Secondary | ICD-10-CM | POA: Diagnosis not present

## 2016-03-15 DIAGNOSIS — Z8673 Personal history of transient ischemic attack (TIA), and cerebral infarction without residual deficits: Secondary | ICD-10-CM | POA: Diagnosis not present

## 2016-03-15 DIAGNOSIS — I10 Essential (primary) hypertension: Secondary | ICD-10-CM | POA: Diagnosis not present

## 2016-03-15 DIAGNOSIS — F0280 Dementia in other diseases classified elsewhere without behavioral disturbance: Secondary | ICD-10-CM | POA: Diagnosis not present

## 2016-03-15 DIAGNOSIS — M6281 Muscle weakness (generalized): Secondary | ICD-10-CM | POA: Diagnosis not present

## 2017-10-03 ENCOUNTER — Encounter (HOSPITAL_COMMUNITY): Payer: Self-pay | Admitting: *Deleted

## 2017-10-03 ENCOUNTER — Other Ambulatory Visit: Payer: Self-pay

## 2017-10-03 ENCOUNTER — Emergency Department (HOSPITAL_COMMUNITY): Payer: Medicare Other

## 2017-10-03 ENCOUNTER — Inpatient Hospital Stay (HOSPITAL_COMMUNITY)
Admission: EM | Admit: 2017-10-03 | Discharge: 2017-10-07 | DRG: 871 | Disposition: A | Discharge status: Hospice | Payer: Medicare Other | Attending: Internal Medicine | Admitting: Internal Medicine

## 2017-10-03 DIAGNOSIS — Z681 Body mass index (BMI) 19 or less, adult: Secondary | ICD-10-CM | POA: Diagnosis not present

## 2017-10-03 DIAGNOSIS — N390 Urinary tract infection, site not specified: Secondary | ICD-10-CM | POA: Diagnosis present

## 2017-10-03 DIAGNOSIS — A4151 Sepsis due to Escherichia coli [E. coli]: Principal | ICD-10-CM | POA: Diagnosis present

## 2017-10-03 DIAGNOSIS — E43 Unspecified severe protein-calorie malnutrition: Secondary | ICD-10-CM | POA: Diagnosis present

## 2017-10-03 DIAGNOSIS — F0390 Unspecified dementia without behavioral disturbance: Secondary | ICD-10-CM | POA: Diagnosis not present

## 2017-10-03 DIAGNOSIS — R531 Weakness: Secondary | ICD-10-CM | POA: Diagnosis not present

## 2017-10-03 DIAGNOSIS — R9389 Abnormal findings on diagnostic imaging of other specified body structures: Secondary | ICD-10-CM

## 2017-10-03 DIAGNOSIS — G309 Alzheimer's disease, unspecified: Secondary | ICD-10-CM

## 2017-10-03 DIAGNOSIS — R680 Hypothermia, not associated with low environmental temperature: Secondary | ICD-10-CM | POA: Diagnosis present

## 2017-10-03 DIAGNOSIS — K7689 Other specified diseases of liver: Secondary | ICD-10-CM | POA: Diagnosis not present

## 2017-10-03 DIAGNOSIS — L89104 Pressure ulcer of unspecified part of back, stage 4: Secondary | ICD-10-CM | POA: Diagnosis not present

## 2017-10-03 DIAGNOSIS — Z1389 Encounter for screening for other disorder: Secondary | ICD-10-CM | POA: Diagnosis not present

## 2017-10-03 DIAGNOSIS — F0280 Dementia in other diseases classified elsewhere without behavioral disturbance: Secondary | ICD-10-CM | POA: Diagnosis present

## 2017-10-03 DIAGNOSIS — T68XXXA Hypothermia, initial encounter: Secondary | ICD-10-CM | POA: Diagnosis present

## 2017-10-03 DIAGNOSIS — E860 Dehydration: Secondary | ICD-10-CM | POA: Diagnosis present

## 2017-10-03 DIAGNOSIS — Z90710 Acquired absence of both cervix and uterus: Secondary | ICD-10-CM

## 2017-10-03 DIAGNOSIS — R1310 Dysphagia, unspecified: Secondary | ICD-10-CM | POA: Diagnosis present

## 2017-10-03 DIAGNOSIS — R6510 Systemic inflammatory response syndrome (SIRS) of non-infectious origin without acute organ dysfunction: Secondary | ICD-10-CM | POA: Diagnosis present

## 2017-10-03 DIAGNOSIS — N281 Cyst of kidney, acquired: Secondary | ICD-10-CM | POA: Diagnosis not present

## 2017-10-03 DIAGNOSIS — E876 Hypokalemia: Secondary | ICD-10-CM | POA: Diagnosis present

## 2017-10-03 DIAGNOSIS — Z8541 Personal history of malignant neoplasm of cervix uteri: Secondary | ICD-10-CM

## 2017-10-03 DIAGNOSIS — I10 Essential (primary) hypertension: Secondary | ICD-10-CM | POA: Diagnosis not present

## 2017-10-03 DIAGNOSIS — E063 Autoimmune thyroiditis: Secondary | ICD-10-CM | POA: Diagnosis not present

## 2017-10-03 DIAGNOSIS — Z515 Encounter for palliative care: Secondary | ICD-10-CM | POA: Diagnosis present

## 2017-10-03 DIAGNOSIS — L89159 Pressure ulcer of sacral region, unspecified stage: Secondary | ICD-10-CM

## 2017-10-03 DIAGNOSIS — L8990 Pressure ulcer of unspecified site, unspecified stage: Secondary | ICD-10-CM

## 2017-10-03 DIAGNOSIS — Z7401 Bed confinement status: Secondary | ICD-10-CM

## 2017-10-03 DIAGNOSIS — L89122 Pressure ulcer of left upper back, stage 2: Secondary | ICD-10-CM | POA: Diagnosis present

## 2017-10-03 DIAGNOSIS — Z7189 Other specified counseling: Secondary | ICD-10-CM

## 2017-10-03 DIAGNOSIS — T68XXXS Hypothermia, sequela: Secondary | ICD-10-CM | POA: Diagnosis not present

## 2017-10-03 DIAGNOSIS — E639 Nutritional deficiency, unspecified: Secondary | ICD-10-CM | POA: Diagnosis not present

## 2017-10-03 DIAGNOSIS — R140 Abdominal distension (gaseous): Secondary | ICD-10-CM | POA: Diagnosis not present

## 2017-10-03 DIAGNOSIS — G301 Alzheimer's disease with late onset: Secondary | ICD-10-CM | POA: Diagnosis not present

## 2017-10-03 DIAGNOSIS — R1110 Vomiting, unspecified: Secondary | ICD-10-CM | POA: Diagnosis not present

## 2017-10-03 DIAGNOSIS — L89150 Pressure ulcer of sacral region, unstageable: Secondary | ICD-10-CM | POA: Diagnosis present

## 2017-10-03 DIAGNOSIS — Z66 Do not resuscitate: Secondary | ICD-10-CM | POA: Diagnosis present

## 2017-10-03 LAB — CBC WITH DIFFERENTIAL/PLATELET
BASOS ABS: 0 10*3/uL (ref 0.0–0.1)
Basophils Relative: 1 %
EOS ABS: 0 10*3/uL (ref 0.0–0.7)
Eosinophils Relative: 1 %
HCT: 39.2 % (ref 36.0–46.0)
HEMOGLOBIN: 12.3 g/dL (ref 12.0–15.0)
LYMPHS ABS: 0.8 10*3/uL (ref 0.7–4.0)
LYMPHS PCT: 22 %
MCH: 28.3 pg (ref 26.0–34.0)
MCHC: 31.4 g/dL (ref 30.0–36.0)
MCV: 90.1 fL (ref 78.0–100.0)
Monocytes Absolute: 0.3 10*3/uL (ref 0.1–1.0)
Monocytes Relative: 10 %
NEUTROS PCT: 66 %
Neutro Abs: 2.2 10*3/uL (ref 1.7–7.7)
Platelets: 325 10*3/uL (ref 150–400)
RBC: 4.35 MIL/uL (ref 3.87–5.11)
RDW: 13.3 % (ref 11.5–15.5)
WBC: 3.4 10*3/uL — AB (ref 4.0–10.5)

## 2017-10-03 LAB — LACTIC ACID, PLASMA
LACTIC ACID, VENOUS: 2.1 mmol/L — AB (ref 0.5–1.9)
LACTIC ACID, VENOUS: 1.4 mmol/L (ref 0.5–1.9)
Lactic Acid, Venous: 1.6 mmol/L (ref 0.5–1.9)
Lactic Acid, Venous: 2.3 mmol/L (ref 0.5–1.9)

## 2017-10-03 LAB — URINALYSIS, ROUTINE W REFLEX MICROSCOPIC
Bilirubin Urine: NEGATIVE
Glucose, UA: NEGATIVE mg/dL
HGB URINE DIPSTICK: NEGATIVE
Ketones, ur: NEGATIVE mg/dL
LEUKOCYTES UA: NEGATIVE
Nitrite: POSITIVE — AB
Protein, ur: NEGATIVE mg/dL
Specific Gravity, Urine: 1.013 (ref 1.005–1.030)
pH: 6 (ref 5.0–8.0)

## 2017-10-03 LAB — COMPREHENSIVE METABOLIC PANEL
ALBUMIN: 3 g/dL — AB (ref 3.5–5.0)
ALT: 14 U/L (ref 14–54)
AST: 21 U/L (ref 15–41)
Alkaline Phosphatase: 132 U/L — ABNORMAL HIGH (ref 38–126)
Anion gap: 7 (ref 5–15)
BUN: 10 mg/dL (ref 6–20)
CHLORIDE: 100 mmol/L — AB (ref 101–111)
CO2: 30 mmol/L (ref 22–32)
Calcium: 9.3 mg/dL (ref 8.9–10.3)
Creatinine, Ser: 0.46 mg/dL (ref 0.44–1.00)
GFR calc Af Amer: >60 mL/min (ref >60)
GFR calc non Af Amer: >60 mL/min (ref >60)
GLUCOSE: 106 mg/dL — AB (ref 65–99)
POTASSIUM: 3.9 mmol/L (ref 3.5–5.1)
SODIUM: 137 mmol/L (ref 135–145)
Total Bilirubin: 0.5 mg/dL (ref 0.3–1.2)
Total Protein: 7.1 g/dL (ref 6.5–8.1)

## 2017-10-03 LAB — TROPONIN I: Troponin I: <0.03 ng/mL (ref <0.03)

## 2017-10-03 LAB — MRSA PCR SCREENING: MRSA BY PCR: NEGATIVE

## 2017-10-03 LAB — PROCALCITONIN

## 2017-10-03 MED ORDER — CEFTRIAXONE SODIUM 1 G IJ SOLR: 1.0000 g | INTRAMUSCULAR | Status: DC

## 2017-10-03 MED ORDER — ENSURE ENLIVE PO LIQD
237.0000 mL | Freq: Two times a day (BID) | ORAL | Status: DC
  Administered 2017-10-05 – 2017-10-06 (×2): 237 mL via ORAL

## 2017-10-03 MED ORDER — SODIUM CHLORIDE 0.9 % IV SOLN
1.0000 g | Freq: Once | INTRAVENOUS | Status: AC
  Administered 2017-10-03: 1 g via INTRAVENOUS
  Filled 2017-10-03: qty 10

## 2017-10-03 MED ORDER — IOPAMIDOL (ISOVUE-370) INJECTION 76%: 80.0000 mL | Freq: Once | INTRAVENOUS | Status: DC | PRN

## 2017-10-03 MED ORDER — ONDANSETRON HCL 4 MG PO TABS: 4.0000 mg | ORAL_TABLET | Freq: Four times a day (QID) | ORAL | Status: DC | PRN

## 2017-10-03 MED ORDER — ONDANSETRON HCL 4 MG/2ML IJ SOLN: 4.0000 mg | Freq: Four times a day (QID) | INTRAMUSCULAR | Status: DC | PRN

## 2017-10-03 MED ORDER — SODIUM CHLORIDE 0.9 % IV SOLN
1.0000 g | INTRAVENOUS | Status: DC
  Administered 2017-10-04 – 2017-10-06 (×3): 1 g via INTRAVENOUS
  Filled 2017-10-03 (×2): qty 10
  Filled 2017-10-03 (×2): qty 1
  Filled 2017-10-03: qty 10

## 2017-10-03 MED ORDER — IOPAMIDOL (ISOVUE-300) INJECTION 61%
80.0000 mL | Freq: Once | INTRAVENOUS | Status: AC | PRN
  Administered 2017-10-03: 80 mL via INTRAVENOUS

## 2017-10-03 MED ORDER — SODIUM CHLORIDE 0.9 % IV SOLN
INTRAVENOUS | Status: DC
  Administered 2017-10-03 – 2017-10-07 (×8): via INTRAVENOUS

## 2017-10-03 MED ORDER — SODIUM CHLORIDE 0.9 % IV BOLUS
500.0000 mL | Freq: Once | INTRAVENOUS | Status: AC
  Administered 2017-10-03: 500 mL via INTRAVENOUS

## 2017-10-03 NOTE — ED Notes (Signed)
Date and time results received: 10/03/17 2026   Test: Lactic Critical Value: 2.1  Name of Provider Notified: MD,David

## 2017-10-03 NOTE — ED Notes (Signed)
EDP notified of temp 94.9 Bair hugger appleid

## 2017-10-03 NOTE — ED Triage Notes (Signed)
Pt seen and sent here from PCP for blood work and bedsores.  Pt lives at home and has family members to take care of her.

## 2017-10-03 NOTE — ED Provider Notes (Addendum)
Patient turned over to me.  Patient with known urinary tract infection cultures done patient started on Rocephin.  Patient went on to be hypothermic started on a warming blanket.  Patient CT of her abdomen and pelvis did not show any evidence of free air there was some concern of this starting with her chest x-ray.  Patient did show some evidence of some colitis may be secondary to some stool retention.  Patient with some failure to thrive.  But does have good caretakers that are working hard to take care of her at home.  Not hypotensive here not tachycardic.  Lactic acid elevated but less than 4.  Will have hospitalist admit.     Fredia Sorrow, MD 10/03/17 1827  CRITICAL CARE Performed by: Fredia Sorrow Total critical care time: 30 minutes Critical care time was exclusive of separately billable procedures and treating other patients. Critical care was necessary to treat or prevent imminent or life-threatening deterioration. Critical care was time spent personally by me on the following activities: development of treatment plan with patient and/or surrogate as well as nursing, discussions with consultants, evaluation of patient's response to treatment, examination of patient, obtaining history from patient or surrogate, ordering and performing treatments and interventions, ordering and review of laboratory studies, ordering and review of radiographic studies, pulse oximetry and re-evaluation of patient's condition.   Blood cultures were not done prior to patient receiving Rocephin but now have been ordered.    Fredia Sorrow, MD 10/03/17 [phone]

## 2017-10-03 NOTE — ED Notes (Signed)
O2 sats fluctuate down to 70's. Poor pleth form. Pt moving fingers back and forth. O2 started at 2 L/min.EDP notiifed

## 2017-10-03 NOTE — H&P (Signed)
History and Physical    Diana Byrd OAC:[id number] DOB: Feb 18, 1924 DOA: 10/03/2017  PCP: Redmond School, MD  Patient coming from: Home  Chief Complaint: Vomited  HPI: Diana Byrd is a 84 y.o. female with medical history significant of advanced dementia who is nonverbal in bedbound went to see her primary care physician today because of vomiting and developing a bedsore.  Only takes care of her at home.  She has not had any fevers.  No diarrhea.  She did vomit once and that was all.  She is not exhibiting any signs of pain.  Family denies any fevers.  Patient was brought to the emergency department from primary care physician office for low blood pressure.  She is found to have a UTI with a temperature of 92 and was referred for admission for urosepsis.  All history obtained from daughter.  Review of Systems: As per HPI otherwise 10 point review of systems negativ per daughter  Past Medical History:  Diagnosis Date  . 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 ABDOMINAL HYSTERECTOMY    . cervical cancer    . THYROID SURGERY       reports that she has never smoked. She has never used smokeless tobacco. She reports that she does not drink alcohol or use drugs.  Allergies  Allergen Reactions  . Penicillins Rash    Has patient had a PCN reaction causing immediate rash, facial/tongue/throat swelling, SOB or lightheadedness with hypotension: Unknown Has patient had a PCN reaction causing severe rash involving mucus membranes or skin necrosis: Unknown Has patient had a PCN reaction that required hospitalization: Unknown Has patient had a PCN reaction occurring within the last 10 years: Unknown If all of the above answers are "NO", then may proceed with Cephalosporin use.     History reviewed. No pertinent family history.  No premature coronary artery disease  Prior to Admission medications   Medication Sig Start Date  End Date Taking? Authorizing Provider  cephALEXin (KEFLEX) 250 MG/5ML suspension Take 5 mLs (250 mg total) by mouth 3 (three) times daily. 09/27/12   Nat Christen, MD  CRANBERRY EXTRACT PO Take 1 capsule by mouth daily.    [provider]  hydrOXYzine (ATARAX/VISTARIL) 25 MG tablet Take 25 mg by mouth 4 (four) times daily as needed for itching or anxiety.    [provider]  nystatin-triamcinolone (MYCOLOG II) cream Apply 1 application topically daily as needed (for irritation).    [provider]  ondansetron (ZOFRAN) 4 MG/5ML solution Take 5 mLs (4 mg total) by mouth 3 (three) times daily as needed for nausea. 09/27/12   Nat Christen, MD  OVER THE COUNTER MEDICATION Take 10 mLs by mouth daily. CalMax is a high-quality, versatile, drinkable powdered supplement packaged in a 5oz container with approximately 20 servings (roughly 2 teaspoons per serving) and contains: .200 mg magnesium .400 mg of calcium  CalMax CalMax,  the perfect, all-in-one supplement solution for your bones, muscles and heart, helps manage stress and prepares your body for your best rest. CalMax  is a high-quality, versatile, drinkable supplement with Magnesium, Calcium, and Vitamin C that works in harmony to help your body with cardiovascular functions, synthesis of energy, heart rate, muscle contraction, every day stress and so much more. Check out our CalMax all-in-one supplement to see how it benefits your health!  The need for calcium and magnesium is so much more complex than  just building and maintaining strong bones. An adequate intake of daily dietary calcium is required to control the heart rate, blood clotting, muscle contraction, and much more. And magnesium is an important co-factor in over 300 enzymatic reactions in the human body, contributing to the production of cardiovascular functions, and the production and synthesis of energy. Calcium and magnesium work in harmony to fight the damage  of everyday stress  .500 mg of vitamin C    [provider]  sorbitol 70 % solution Take 30 mLs by mouth 2 (two) times daily.    [provider]    Physical Exam: Vitals:   10/03/17 1600 10/03/17 1630 10/03/17 1802 10/03/17 1830  BP: (!) 143/77 (!) 153/76  (!) 146/97  Pulse:    74  Resp: 16 13  20   Temp:   (!) 94.9 F (34.9 C)   TempSrc:   Rectal   SpO2:    100%  Weight:      Height:          Constitutional: NAD, calm, comfortable contracted and demented nonverbal Vitals:   10/03/17 1600 10/03/17 1630 10/03/17 1802 10/03/17 1830  BP: (!) 143/77 (!) 153/76  (!) 146/97  Pulse:    74  Resp: 16 13  20   Temp:   (!) 94.9 F (34.9 C)   TempSrc:   Rectal   SpO2:    100%  Weight:      Height:       Eyes: PERRL, lids and conjunctivae normal ENMT: Mucous membranes are moist. Posterior pharynx clear of any exudate or lesions.Normal dentition.  Neck: normal, supple, no masses, no thyromegaly Respiratory: clear to auscultation bilaterally, no wheezing, no crackles. Normal respiratory effort. No accessory muscle use.  Cardiovascular: Regular rate and rhythm, no murmurs / rubs / gallops. No extremity edema. 2+ pedal pulses. No carotid bruits.  Abdomen: no tenderness, no masses palpated. No hepatosplenomegaly. Bowel sounds positive.  Musculoskeletal: no clubbing / cyanosis. No joint deformity upper and lower extremities. Good ROM, with contractures. Normal muscle tone.  Skin: Wound per nursing notes  neurologic: CN 2-12 grossly intact. Sensation intact, DTR normal. Strength 5/5 in all 4.  Psychiatric: Not normal judgment and insight. Alert and oriented x 0. Normal mood.    Labs on Admission: I have personally reviewed following labs and imaging studies  CBC: Recent Labs  Lab 10/03/17 1336  WBC 3.4*  NEUTROABS 2.2  HGB 12.3  HCT 39.2  MCV 90.1  PLT 762   Basic Metabolic Panel: Recent Labs  Lab 10/03/17 1336  NA 137  K 3.9  CL 100*  CO2 30  GLUCOSE  106*  BUN 10  CREATININE 0.46  CALCIUM 9.3   GFR: Estimated Creatinine Clearance: 32.2 mL/min (by C-G formula based on SCr of 0.46 mg/dL). Liver Function Tests: Recent Labs  Lab 10/03/17 1336  AST 21  ALT 14  ALKPHOS 132*  BILITOT 0.5  PROT 7.1  ALBUMIN 3.0*   No results for input(s): LIPASE, AMYLASE in the last 168 hours. No results for input(s): AMMONIA in the last 168 hours. Coagulation Profile: No results for input(s): INR, PROTIME in the last 168 hours. Cardiac Enzymes: Recent Labs  Lab 10/03/17 1336  TROPONINI <0.03   BNP (last 3 results) No results for input(s): PROBNP in the last 8760 hours. HbA1C: No results for input(s): HGBA1C in the last 72 hours. CBG: No results for input(s): GLUCAP in the last 168 hours. Lipid Profile: No results for input(s): CHOL, HDL,  LDLCALC, TRIG, CHOLHDL, LDLDIRECT in the last 72 hours. Thyroid Function Tests: No results for input(s): TSH, T4TOTAL, FREET4, T3FREE, THYROIDAB in the last 72 hours. Anemia Panel: No results for input(s): VITAMINB12, FOLATE, FERRITIN, TIBC, IRON, RETICCTPCT in the last 72 hours. Urine analysis:    Component Value Date/Time   COLORURINE YELLOW 10/03/2017 1254   APPEARANCEUR HAZY (A) 10/03/2017 1254   LABSPEC 1.013 10/03/2017 1254   PHURINE 6.0 10/03/2017 1254   GLUCOSEU NEGATIVE 10/03/2017 1254   HGBUR NEGATIVE 10/03/2017 1254   BILIRUBINUR NEGATIVE 10/03/2017 1254   KETONESUR NEGATIVE 10/03/2017 1254   PROTEINUR NEGATIVE 10/03/2017 1254   UROBILINOGEN 0.2 09/27/2012 2249   NITRITE POSITIVE (A) 10/03/2017 1254   LEUKOCYTESUR NEGATIVE 10/03/2017 1254   Sepsis Labs: !!!!!!!!!!!!!!!!!!!!!!!!!!!!!!!!!!!!!!!!!!!! @LABRCNTIP (procalcitonin:4,lacticidven:4) )No results found for this or any previous visit (from the past 240 hour(s)).   Radiological Exams on Admission: Ct Abdomen Pelvis W Contrast  Result Date: 10/03/2017 CLINICAL DATA:  84 year old female. Question abnormal chest x-ray/abdominal  films. Dementia. Post hysterectomy. Bed sores. Subsequent encounter. EXAM: CT ABDOMEN AND PELVIS WITH CONTRAST TECHNIQUE: Multidetector CT imaging of the abdomen and pelvis was performed using the standard protocol following bolus administration of intravenous contrast. CONTRAST:  31mL ISOVUE-300 IOPAMIDOL (ISOVUE-300) INJECTION 61% COMPARISON:  Abdominal film chest x-ray 10/03/2017. 09/27/2012 and 06/21/2012 CT abdomen and pelvis. FINDINGS: Lower chest: Basilar subsegmental atelectasis/scarring. Minimal nodular density right middle lobe stable. Mild cardiomegaly. Coronary artery calcifications. Mild prominence pulmonary artery. Hepatobiliary: No worrisome hepatic lesion. No calcified gallstones. Pancreas: No pancreatic mass or inflammation. Spleen: No splenic mass or enlargement. Adrenals/Urinary Tract: No obstructing stone. Extra renal pelvis without significant change. Tiny renal cysts without worrisome renal or adrenal mass. Stomach/Bowel: Marked amount of stool rectosigmoid region placing the patient at risk for stercoral colitis. Gas-filled ascending colon and proximal transverse colon. Lack of fat planes and under distension limited evaluation of small bowel and stomach. No obvious small bowel or gastric abnormality detected. Vascular/Lymphatic: Atherosclerotic changes abdominal aorta with ectasia. Maximal transverse dimension 2.7 x 2.4 cm. Atherosclerotic changes aortic branch vessels without large vessel occlusion. Attenuated inferior mesenteric artery. No adenopathy. Reproductive: Prior hysterectomy.  No worrisome adnexal mass. Other: No free intraperitoneal air or bowel containing hernia. Musculoskeletal: Sacral decubitus extends to the lower sacrum/coccyx. Scoliosis thoracic and lumbar spine. IMPRESSION: No free intraperitoneal air. Gas filled prominent size ascending colon and proximal transverse colon. Prominent stool rectosigmoid region placing the patient at risk for stercoral colitis. Limited  evaluation of stomach and small bowel secondary to under distension and lack of fat planes. No obvious gastric/small bowel abnormality. Sacral decubitus extends to the lower sacrum and coccyx. Aortic Atherosclerosis (ICD10-I70.0). Abdominal aortic ectasia. Atherosclerotic changes aortic branch vessels with attenuated inferior mesenteric artery. Coronary artery calcifications. Electronically Signed   By: Genia Del M.D.   On: 10/03/2017 18:04   Dg Chest Port 1 View  Result Date: 10/03/2017 CLINICAL DATA:  Weakness.  History of dementia. EXAM: PORTABLE CHEST 1 VIEW COMPARISON:  09/27/2012.  04/22/2012. FINDINGS: Prior median sternotomy. Cardiomegaly with tortuosity of the great vessels again noted without interim change. No focal infiltrate. Low lung volumes with mild bibasilar subsegmental atelectasis. No prominent pleural effusion. No pneumothorax. Distended loops of bowel. Free air under the hemidiaphragm may be present. Abdominal series suggested for further evaluation. No acute bony abnormality. IMPRESSION: 1. Distended loops of bowel with possible free intraperitoneal air. Abdominal series suggested for further evaluation. 2. Prior CABG. Stable cardiomegaly and tortuous great vessels. Mild left base subsegmental atelectasis. Critical  Value/emergent results were called by telephone at the time of interpretation on 10/03/2017 at 1:25 pm to Dr. Francine Graven , who verbally acknowledged these results. Electronically Signed   By: Marcello Moores  Register   On: 10/03/2017 13:28   Dg Abd 2 Views  Result Date: 10/03/2017 CLINICAL DATA:  Abnormal chest x-ray. EXAM: ABDOMEN - 2 VIEW COMPARISON:  Chest x-ray 10/03/2017 FINDINGS: There is no bowel dilatation to suggest obstruction. There is large relative lucency overlying the liver. On the decubitus view there is no free intraperitoneal air identified. There are no pathologic calcifications along the expected course of the ureters. The osseous structures are  unremarkable. IMPRESSION: On left lateral decubitus view there is no definite free intraperitoneal air identified but there is a large relative lucency overlying the liver on the supine view and also on the chest x-ray performed earlier same day which can be seen with free air secondary to perforated viscus. Given the discordant findings on the decubitus versus supine view, recommend further evaluation with a CT of the abdomen/pelvis. Electronically Signed   By: Kathreen Devoid   On: 10/03/2017 15:38    Old chart reviewed Case discussed with EDP  Assessment/Plan 84 year old female with Sirs from UTI  Principal Problem:   Hypothermia-placed on warming blanket.  Probably due to infection and Sirs/sepsis  Active Problems:   Acute lower UTI-placed on IV Rocephin.  IV fluids.   SIRS (systemic inflammatory response syndrome) (HCC)-/sepsis as above antibiotics   Dementia-stable Decubitus wound-obtain wound care consult    DVT prophylaxis: SCDs Code Status: Full discussed with daughter Family Communication: Daughter Disposition Plan: Per day team Consults called: Wound care Admission status: Observation   Kriss Ishler A MD [facility]  If 7PM-7AM, please contact night-coverage [email or web address] Password [facility]  10/03/2017, 6:40 PM

## 2017-10-03 NOTE — ED Notes (Signed)
Date and time results received: 10/03/17 4:11 PM  (use smartphrase ".now" to insert current time)  Test: Lactic Acid Critical Value: 2.3  Name of Provider Notified: Neita Goodnight  Orders Received? Or Actions Taken?: Orders Received - See Orders for details

## 2017-10-03 NOTE — ED Notes (Signed)
Pt emaciated with bony prominences. Contractures to upper and lower extremities. Pt non verbal but will smile at family.

## 2017-10-03 NOTE — ED Notes (Signed)
Family is feeding pt applesauce

## 2017-10-03 NOTE — ED Notes (Addendum)
Incontinence care provided and pt repositioned

## 2017-10-03 NOTE — ED Provider Notes (Signed)
[facility] EMERGENCY DEPARTMENT Provider Note   CSN: [id number] Arrival date & time: 10/03/17  1214     History   Chief Complaint Chief Complaint  Patient presents with  . bedsores    HPI Diana Byrd is a 84 y.o. female.  The history is provided by a caregiver and a relative. The history is limited by the condition of the patient (Hx dementia).  Pt was seen at 1235. Per pt's family: Pt sent to the ED by PCP for "some blood work and her bedsores." Family cares for pt at home. States pt's sacral bedsore is "draining" and "red." Pt has had poor PO intake for "a while." Pt has significant hx of dementia.    Past Medical History:  Diagnosis Date  . 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   Past Surgical History:  Procedure Laterality Date  . ABDOMINAL HYSTERECTOMY    . cervical cancer    . THYROID SURGERY       OB History   None      Home Medications    Prior to Admission medications   Medication Sig Start Date End Date Taking? Authorizing Provider  cephALEXin (KEFLEX) 250 MG/5ML suspension Take 5 mLs (250 mg total) by mouth 3 (three) times daily. 09/27/12   Nat Christen, MD  CRANBERRY EXTRACT PO Take 1 capsule by mouth daily.    [provider]  hydrOXYzine (ATARAX/VISTARIL) 25 MG tablet Take 25 mg by mouth 4 (four) times daily as needed for itching or anxiety.    [provider]  nystatin-triamcinolone (MYCOLOG II) cream Apply 1 application topically daily as needed (for irritation).    [provider]  ondansetron (ZOFRAN) 4 MG/5ML solution Take 5 mLs (4 mg total) by mouth 3 (three) times daily as needed for nausea. 09/27/12   Nat Christen, MD  OVER THE COUNTER MEDICATION Take 10 mLs by mouth daily. CalMax is a high-quality, versatile, drinkable powdered supplement packaged in a 5oz container with approximately 20 servings (roughly 2 teaspoons per serving) and  contains: .200 mg magnesium .400 mg of calcium  CalMax CalMax,  the perfect, all-in-one supplement solution for your bones, muscles and heart, helps manage stress and prepares your body for your best rest. CalMax  is a high-quality, versatile, drinkable supplement with Magnesium, Calcium, and Vitamin C that works in harmony to help your body with cardiovascular functions, synthesis of energy, heart rate, muscle contraction, every day stress and so much more. Check out our CalMax all-in-one supplement to see how it benefits your health!  The need for calcium and magnesium is so much more complex than just building and maintaining strong bones. An adequate intake of daily dietary calcium is required to control the heart rate, blood clotting, muscle contraction, and much more. And magnesium is an important co-factor in over 300 enzymatic reactions in the human body, contributing to the production of cardiovascular functions, and the production and synthesis of energy. Calcium and magnesium work in harmony to fight the damage of everyday stress  .500 mg of vitamin C    [provider]  sorbitol 70 % solution Take 30 mLs by mouth 2 (two) times daily.    [provider]    Family History History reviewed. No pertinent family history.  Social History Social History   Tobacco Use  . Smoking status: Never Smoker  . Smokeless tobacco: Never Used  Substance Use  Topics  . Alcohol use: No  . Drug use: No     Allergies   Penicillins   Review of Systems Review of Systems  Unable to perform ROS: Dementia     Physical Exam Updated Vital Signs BP (!) 145/72 Comment: sat 98 %  Pulse 70   Temp 97.6 F (36.4 C) (Temporal)   Resp (!) 9 Comment: sat 98 %  Ht 5\' 3"  (1.6 m)   Wt 45.4 kg (100 lb)   SpO2 93%   BMI 17.71 kg/m    Patient Vitals for the past 24 hrs:  BP Temp Temp src Pulse Resp SpO2 Height Weight  10/03/17 1530 129/87 - - - 17 - - -  10/03/17 1430  136/75 - - 61 13 100 % - -  10/03/17 1415 - - - 60 11 100 % - -  10/03/17 1400 138/80 - - - 13 - - -  10/03/17 1330 (!) 145/72 - - - (!) 9 - - -  10/03/17 1311 (!) 146/79 - - 70 18 93 % - -  10/03/17 1238 (!) 146/79 - - 76 20 - - -  10/03/17 1221 - - - - - - 5\' 3"  (1.6 m) 45.4 kg (100 lb)  10/03/17 1220 (!) 73/61 97.6 F (36.4 C) Temporal 75 20 92 % - -    Physical Exam 1240: Physical examination:  Nursing notes reviewed; Vital signs and O2 SAT reviewed;  Constitutional: Thin, frail. In no acute distress; Head:  Normocephalic, atraumatic; Eyes: EOMI, PERRL, No scleral icterus; ENMT: Mouth and pharynx normal, Mucous membranes dry; Neck: Supple, Full range of motion, No lymphadenopathy; Cardiovascular: Regular rate and rhythm, No gallop; Respiratory: Breath sounds clear & equal bilaterally, No wheezes. Normal respiratory effort/excursion; Chest: Nontender, Movement normal; Abdomen: Soft, Nontender, Nondistended, Normal bowel sounds; Genitourinary: No CVA tenderness; Spine:  No midline CS, TS, LS tenderness. +large decub on sacrum with eschar, scant purulent drainage, and mild surrounding erythema.;;  Extremities: Peripheral pulses normal, +small red area to left anterior shoulder. +shallow decub right lateral shoulder with mild surrounding erythema, no drainage. No edema, No calf edema or asymmetry.; Neuro: Awake, alert. Laying eyes open. Will smile at family. Non-verbal at baseline. +arms and legs contracted..; Skin: Color normal, Warm, Dry.   ED Treatments / Results  Labs (all labs ordered are listed, but only abnormal results are displayed)   EKG EKG Interpretation  Date/Time:  Tuesday Oct 03 2017 13:01:31 EDT Ventricular Rate:  67 PR Interval:    QRS Duration: 98 QT Interval:  390 QTC Calculation: 412 R Axis:   -59 Text Interpretation:  Normal sinus rhythm Inferior infarct, old Probable anterolateral infarct, recent Artifact Poor data quality in current ECG precludes serial  comparison no apparent acute changes from previous EKG dated 06/21/2012 Confirmed by Francine Graven [phone]) on 10/03/2017 1:35:55 PM   Radiology   Procedures Procedures (including critical care time)  Medications Ordered in ED Medications  0.9 %  sodium chloride infusion ( Intravenous New Bag/Given 10/03/17 1300)  cefTRIAXone (ROCEPHIN) 1 g in sodium chloride 0.9 % 100 mL IVPB (has no administration in time range)  sodium chloride 0.9 % bolus 500 mL (0 mLs Intravenous Stopped 10/03/17 1423)     Initial Impression / Assessment and Plan / ED Course  I have reviewed the triage vital signs and the nursing notes.  Pertinent labs & imaging results that were available during my care of the patient were reviewed by me and considered in my medical decision  making (see chart for details).  MDM Reviewed: previous chart, nursing note and vitals Reviewed previous: labs and ECG Interpretation: labs, ECG and x-ray   Results for orders placed or performed during the hospital encounter of 10/03/17  Comprehensive metabolic panel  Result Value Ref Range   Sodium 137 135 - 145 mmol/L   Potassium 3.9 3.5 - 5.1 mmol/L   Chloride 100 (L) 101 - 111 mmol/L   CO2 30 22 - 32 mmol/L   Glucose, Bld 106 (H) 65 - 99 mg/dL   BUN 10 6 - 20 mg/dL   Creatinine, Ser 0.46 0.44 - 1.00 mg/dL   Calcium 9.3 8.9 - 10.3 mg/dL   Total Protein 7.1 6.5 - 8.1 g/dL   Albumin 3.0 (L) 3.5 - 5.0 g/dL   AST 21 15 - 41 U/L   ALT 14 14 - 54 U/L   Alkaline Phosphatase 132 (H) 38 - 126 U/L   Total Bilirubin 0.5 0.3 - 1.2 mg/dL   GFR calc non Af Amer >60 >60 mL/min   GFR calc Af Amer >60 >60 mL/min   Anion gap 7 5 - 15  Troponin I  Result Value Ref Range   Troponin I <0.03 <0.03 ng/mL  Lactic acid, plasma  Result Value Ref Range   Lactic Acid, Venous 1.6 0.5 - 1.9 mmol/L  CBC with Differential  Result Value Ref Range   WBC 3.4 (L) 4.0 - 10.5 K/uL   RBC 4.35 3.87 - 5.11 MIL/uL   Hemoglobin 12.3 12.0 - 15.0 g/dL    HCT 39.2 36.0 - 46.0 %   MCV 90.1 78.0 - 100.0 fL   MCH 28.3 26.0 - 34.0 pg   MCHC 31.4 30.0 - 36.0 g/dL   RDW 13.3 11.5 - 15.5 %   Platelets 325 150 - 400 K/uL   Neutrophils Relative % 66 %   Neutro Abs 2.2 1.7 - 7.7 K/uL   Lymphocytes Relative 22 %   Lymphs Abs 0.8 0.7 - 4.0 K/uL   Monocytes Relative 10 %   Monocytes Absolute 0.3 0.1 - 1.0 K/uL   Eosinophils Relative 1 %   Eosinophils Absolute 0.0 0.0 - 0.7 K/uL   Basophils Relative 1 %   Basophils Absolute 0.0 0.0 - 0.1 K/uL  Urinalysis, Routine w reflex microscopic  Result Value Ref Range   Color, Urine YELLOW YELLOW   APPearance HAZY (A) CLEAR   Specific Gravity, Urine 1.013 1.005 - 1.030   pH 6.0 5.0 - 8.0   Glucose, UA NEGATIVE NEGATIVE mg/dL   Hgb urine dipstick NEGATIVE NEGATIVE   Bilirubin Urine NEGATIVE NEGATIVE   Ketones, ur NEGATIVE NEGATIVE mg/dL   Protein, ur NEGATIVE NEGATIVE mg/dL   Nitrite POSITIVE (A) NEGATIVE   Leukocytes, UA NEGATIVE NEGATIVE   RBC / HPF 0-5 0 - 5 RBC/hpf   WBC, UA 0-5 0 - 5 WBC/hpf   Bacteria, UA MANY (A) NONE SEEN   Squamous Epithelial / LPF 0-5 0 - 5   Dg Chest Port 1 View Result Date: 10/03/2017 CLINICAL DATA:  Weakness.  History of dementia. EXAM: PORTABLE CHEST 1 VIEW COMPARISON:  09/27/2012.  04/22/2012. FINDINGS: Prior median sternotomy. Cardiomegaly with tortuosity of the great vessels again noted without interim change. No focal infiltrate. Low lung volumes with mild bibasilar subsegmental atelectasis. No prominent pleural effusion. No pneumothorax. Distended loops of bowel. Free air under the hemidiaphragm may be present. Abdominal series suggested for further evaluation. No acute bony abnormality. IMPRESSION: 1. Distended loops of bowel with  possible free intraperitoneal air. Abdominal series suggested for further evaluation. 2. Prior CABG. Stable cardiomegaly and tortuous great vessels. Mild left base subsegmental atelectasis. Critical Value/emergent results were called by  telephone at the time of interpretation on 10/03/2017 at 1:25 pm to Dr. Francine Graven , who verbally acknowledged these results. Electronically Signed   By: Marcello Moores  Register   On: 10/03/2017 13:28   Dg Abd 2 Views Result Date: 10/03/2017 CLINICAL DATA:  Abnormal chest x-ray. EXAM: ABDOMEN - 2 VIEW COMPARISON:  Chest x-ray 10/03/2017 FINDINGS: There is no bowel dilatation to suggest obstruction. There is large relative lucency overlying the liver. On the decubitus view there is no free intraperitoneal air identified. There are no pathologic calcifications along the expected course of the ureters. The osseous structures are unremarkable. IMPRESSION: On left lateral decubitus view there is no definite free intraperitoneal air identified but there is a large relative lucency overlying the liver on the supine view and also on the chest x-ray performed earlier same day which can be seen with free air secondary to perforated viscus. Given the discordant findings on the decubitus versus supine view, recommend further evaluation with a CT of the abdomen/pelvis. Electronically Signed   By: Kathreen Devoid   On: 10/03/2017 15:38     1600:  +UTI, UC pending; will dose IV rocephin. Sacral wound culture pending. Judicious IVF bolus given for soft BP with improvement. XR as above. Follow up CT A/P ordered and pending. Sign out to Dr. Rogene Houston.     Final Clinical Impressions(s) / ED Diagnoses   Final diagnoses:  Abnormal chest x-ray    ED Discharge Orders    None       Francine Graven, DO 10/03/17 1603

## 2017-10-04 DIAGNOSIS — N390 Urinary tract infection, site not specified: Secondary | ICD-10-CM

## 2017-10-04 DIAGNOSIS — Z515 Encounter for palliative care: Secondary | ICD-10-CM | POA: Diagnosis not present

## 2017-10-04 DIAGNOSIS — T68XXXS Hypothermia, sequela: Secondary | ICD-10-CM | POA: Diagnosis not present

## 2017-10-04 DIAGNOSIS — G309 Alzheimer's disease, unspecified: Secondary | ICD-10-CM | POA: Diagnosis not present

## 2017-10-04 DIAGNOSIS — Z8541 Personal history of malignant neoplasm of cervix uteri: Secondary | ICD-10-CM | POA: Diagnosis not present

## 2017-10-04 DIAGNOSIS — Z66 Do not resuscitate: Secondary | ICD-10-CM | POA: Diagnosis present

## 2017-10-04 DIAGNOSIS — E46 Unspecified protein-calorie malnutrition: Secondary | ICD-10-CM | POA: Diagnosis not present

## 2017-10-04 DIAGNOSIS — L89150 Pressure ulcer of sacral region, unstageable: Secondary | ICD-10-CM | POA: Diagnosis present

## 2017-10-04 DIAGNOSIS — L8990 Pressure ulcer of unspecified site, unspecified stage: Secondary | ICD-10-CM

## 2017-10-04 DIAGNOSIS — G301 Alzheimer's disease with late onset: Secondary | ICD-10-CM | POA: Diagnosis present

## 2017-10-04 DIAGNOSIS — A419 Sepsis, unspecified organism: Secondary | ICD-10-CM

## 2017-10-04 DIAGNOSIS — E876 Hypokalemia: Secondary | ICD-10-CM | POA: Diagnosis present

## 2017-10-04 DIAGNOSIS — L89122 Pressure ulcer of left upper back, stage 2: Secondary | ICD-10-CM | POA: Diagnosis present

## 2017-10-04 DIAGNOSIS — L89159 Pressure ulcer of sacral region, unspecified stage: Secondary | ICD-10-CM

## 2017-10-04 DIAGNOSIS — Z681 Body mass index (BMI) 19 or less, adult: Secondary | ICD-10-CM | POA: Diagnosis not present

## 2017-10-04 DIAGNOSIS — A4151 Sepsis due to Escherichia coli [E. coli]: Secondary | ICD-10-CM | POA: Diagnosis not present

## 2017-10-04 DIAGNOSIS — Z7189 Other specified counseling: Secondary | ICD-10-CM | POA: Diagnosis not present

## 2017-10-04 DIAGNOSIS — R1110 Vomiting, unspecified: Secondary | ICD-10-CM | POA: Diagnosis not present

## 2017-10-04 DIAGNOSIS — E43 Unspecified severe protein-calorie malnutrition: Secondary | ICD-10-CM | POA: Diagnosis not present

## 2017-10-04 DIAGNOSIS — Z90710 Acquired absence of both cervix and uterus: Secondary | ICD-10-CM | POA: Diagnosis not present

## 2017-10-04 DIAGNOSIS — R1310 Dysphagia, unspecified: Secondary | ICD-10-CM | POA: Diagnosis present

## 2017-10-04 DIAGNOSIS — F0280 Dementia in other diseases classified elsewhere without behavioral disturbance: Secondary | ICD-10-CM | POA: Diagnosis not present

## 2017-10-04 DIAGNOSIS — E860 Dehydration: Secondary | ICD-10-CM | POA: Diagnosis not present

## 2017-10-04 DIAGNOSIS — Z7401 Bed confinement status: Secondary | ICD-10-CM | POA: Diagnosis not present

## 2017-10-04 DIAGNOSIS — C539 Malignant neoplasm of cervix uteri, unspecified: Secondary | ICD-10-CM | POA: Diagnosis not present

## 2017-10-04 DIAGNOSIS — T68XXXD Hypothermia, subsequent encounter: Secondary | ICD-10-CM | POA: Diagnosis not present

## 2017-10-04 DIAGNOSIS — R680 Hypothermia, not associated with low environmental temperature: Secondary | ICD-10-CM | POA: Diagnosis present

## 2017-10-04 LAB — PROCALCITONIN: Procalcitonin: <0.1 ng/mL

## 2017-10-04 LAB — BASIC METABOLIC PANEL
Anion gap: 5 (ref 5–15)
BUN: 7 mg/dL (ref 6–20)
CHLORIDE: 105 mmol/L (ref 101–111)
CO2: 27 mmol/L (ref 22–32)
Calcium: 8.6 mg/dL — ABNORMAL LOW (ref 8.9–10.3)
Creatinine, Ser: 0.35 mg/dL — ABNORMAL LOW (ref 0.44–1.00)
GFR calc Af Amer: >60 mL/min (ref >60)
GFR calc non Af Amer: >60 mL/min (ref >60)
Glucose, Bld: 107 mg/dL — ABNORMAL HIGH (ref 65–99)
POTASSIUM: 3.8 mmol/L (ref 3.5–5.1)
Sodium: 137 mmol/L (ref 135–145)

## 2017-10-04 LAB — CBC
HEMATOCRIT: 35 % — AB (ref 36.0–46.0)
Hemoglobin: 11.1 g/dL — ABNORMAL LOW (ref 12.0–15.0)
MCH: 28.5 pg (ref 26.0–34.0)
MCHC: 31.7 g/dL (ref 30.0–36.0)
MCV: 90 fL (ref 78.0–100.0)
Platelets: 300 10*3/uL (ref 150–400)
RBC: 3.89 MIL/uL (ref 3.87–5.11)
RDW: 13.4 % (ref 11.5–15.5)
WBC: 4.5 10*3/uL (ref 4.0–10.5)

## 2017-10-04 MED ORDER — SORBITOL 70 % SOLN
960.0000 mL | TOPICAL_OIL | Freq: Once | ORAL | Status: AC
  Administered 2017-10-04: 960 mL via RECTAL
  Filled 2017-10-04: qty 473

## 2017-10-04 NOTE — Progress Notes (Signed)
Bear Hugger placed back on pt d/t temperature decreasing back to 95.5. Will continue to monitor pt

## 2017-10-04 NOTE — Progress Notes (Signed)
Foley catheter placed per Dr. Manuella Ghazi d/t urinary retention. Urine return upon catheter placement- sediment noted in urine. Pt already diagnosed with UTI upon admit. Will continue to monitor pt

## 2017-10-04 NOTE — Progress Notes (Signed)
Pt given smog enema with large BM as result.  Pt cleaned up- will continue to monitor

## 2017-10-04 NOTE — Progress Notes (Signed)
[facility] PROGRESS NOTE  Diana NICHTER JEH:[id number] DOB: [birth year] DOA: 10/03/2017 PCP: Redmond School, MD  Assessment/Plan: 1-sepsis due to UTI: Also with concern of potential ongoing infection in her decubitus ulcer. -Continue IV Rocephin -Follow cultures -Continue IV fluids -Follow clinical response. -Patient is still hypothermic. -continue bear hugger   2-dementia -continue supportive care -no behavioral disturbances   3-sigmoid-rectal stool burden -will give Smog enema and follow response -continue IVF's  4-dysphagia -continue dysphagia 1 diet and honey thick liquids   5-severe protein calorie malnutrition  -feeding supplements as recommended by dietitian  -Body mass index is 12.44 kg/m.  6-pressure injury: stage 2 on left shoulder and unstageable on her sacrum area -general surgery consulted for debridement  -continue preventive measures and constant repositioning   Code Status: DNR Family Communication: Son and daughter at bedside Disposition Plan: Continue IV antibiotics, follow general surgery recommendations for the needs of debridement of her decubitus ulcer, follow cultures results, follow recommendations and outcome of goals of care meeting with palliative care service.   Consultants:  Palliative care  General surgery  Procedures:  See below for x-ray reports.  Antibiotics:  Rocephin  10/03/17  HPI/Subjective: No distress, nonverbal, per family at bedside no chest pain, no shortness of breath, was able to eat dysphagia 1 with honey thick liquids.  Objective: Vitals:   10/04/17 1131 10/04/17 1200  BP:  95/60  Pulse:    Resp: 14 14  Temp: 99.7 F (37.6 C) 99.7 F (37.6 C)  SpO2:      Intake/Output Summary (Last 24 hours) at 10/04/2017 1745 Last data filed at 10/04/2017 1200 Gross per 24 hour  Intake 1845 ml  Output 500 ml  Net 1345 ml   Filed Weights   10/03/17 1221 10/03/17 2102 10/04/17 0500  Weight: 45.4 kg (100 lb)  33.9 kg (74 lb 11.8 oz) 33.9 kg (74 lb 11.8 oz)    Exam:   General: Frail, significantly underweight, slightly cool on palpation, in no acute distress.  Patient nonverbal.   Cardiovascular: S1 and S2, positive systolic ejection murmur, no rubs, no gallops, no JVD.  Respiratory: Good air movement bilaterally, no wheezing, no crackles.  Abdomen: Soft, nontender, nondistended, positive bowel sounds  Musculoskeletal: Ongoing muscular atrophy appreciated in 4 limbs, positive contracture especially affecting her upper extremities, no cyanosis, no clubbing.  Skin: Patient with a stage II pressure injury on her left shoulder (no drainage appreciated), also with unstageable decubitus ulcer, with yellowish drainage and appreciated discomfort to the patient's on palpation.  Of note both wounds were present prior to admission.  Data Reviewed: Basic Metabolic Panel: Recent Labs  Lab 10/03/17 1336 10/04/17 0503  NA 137 137  K 3.9 3.8  CL 100* 105  CO2 30 27  GLUCOSE 106* 107*  BUN 10 7  CREATININE 0.46 0.35*  CALCIUM 9.3 8.6*   Liver Function Tests: Recent Labs  Lab 10/03/17 1336  AST 21  ALT 14  ALKPHOS 132*  BILITOT 0.5  PROT 7.1  ALBUMIN 3.0*   CBC: Recent Labs  Lab 10/03/17 1336 10/04/17 0503  WBC 3.4* 4.5  NEUTROABS 2.2  --   HGB 12.3 11.1*  HCT 39.2 35.0*  MCV 90.1 90.0  PLT 325 300   Cardiac Enzymes: Recent Labs  Lab 10/03/17 1336  TROPONINI <0.03    Recent Results (from the past 240 hour(s))  Wound or Superficial Culture     Status: None (Preliminary result)   Collection Time: 10/03/17 12:51 PM  Result Value  Ref Range Status   Specimen Description   Final    SACRAL Performed at [facility], [address], [place], [place] [address]    Special Requests   Final    Normal Performed at [place] [place], [place] [address]    Gram Stain   Final    RARE WBC PRESENT, PREDOMINANTLY PMN ABUNDANT GRAM POSITIVE RODS ABUNDANT GRAM  POSITIVE COCCI MODERATE GRAM NEGATIVE RODS    Culture   Final    CULTURE REINCUBATED FOR BETTER GROWTH Performed at [facility], [place] [address] [place], [place] [address]    Report Status PENDING  Incomplete  Culture, blood (Routine X 2) w Reflex to ID Panel     Status: None (Preliminary result)   Collection Time: 10/03/17  6:50 PM  Result Value Ref Range Status   Specimen Description BLOOD LEFT ARM  Final   Special Requests   Final    BOTTLES DRAWN AEROBIC ONLY Blood Culture adequate volume   Culture   Final    NO GROWTH < 24 HOURS Performed at [facility], [address], [place], [place] [address]    Report Status PENDING  Incomplete  Culture, blood (Routine X 2) w Reflex to ID Panel     Status: None (Preliminary result)   Collection Time: 10/03/17  7:03 PM  Result Value Ref Range Status   Specimen Description BLOOD LEFT HAND  Final   Special Requests   Final    BOTTLES DRAWN AEROBIC ONLY Blood Culture adequate volume   Culture   Final    NO GROWTH < 24 HOURS Performed at [facility], [address] [place], [place] [address]    Report Status PENDING  Incomplete  MRSA PCR Screening     Status: None   Collection Time: 10/03/17  8:40 PM  Result Value Ref Range Status   MRSA by PCR NEGATIVE NEGATIVE Final    Comment:        The GeneXpert MRSA Assay (FDA approved for NASAL specimens only), is one component of a comprehensive MRSA colonization surveillance program. It is not intended to diagnose MRSA infection nor to guide or monitor treatment for MRSA infections. Performed at [facility], [address] [place], [place] [address]      Studies: Ct Abdomen Pelvis W Contrast  Result Date: 10/03/2017 CLINICAL DATA:  84 year old female. Question abnormal chest x-ray/abdominal films. Dementia. Post hysterectomy. Bed sores. Subsequent encounter. EXAM: CT ABDOMEN AND PELVIS WITH CONTRAST TECHNIQUE: Multidetector CT imaging of the abdomen and pelvis was performed  using the standard protocol following bolus administration of intravenous contrast. CONTRAST:  32mL ISOVUE-300 IOPAMIDOL (ISOVUE-300) INJECTION 61% COMPARISON:  Abdominal film chest x-ray 10/03/2017. 09/27/2012 and 06/21/2012 CT abdomen and pelvis. FINDINGS: Lower chest: Basilar subsegmental atelectasis/scarring. Minimal nodular density right middle lobe stable. Mild cardiomegaly. Coronary artery calcifications. Mild prominence pulmonary artery. Hepatobiliary: No worrisome hepatic lesion. No calcified gallstones. Pancreas: No pancreatic mass or inflammation. Spleen: No splenic mass or enlargement. Adrenals/Urinary Tract: No obstructing stone. Extra renal pelvis without significant change. Tiny renal cysts without worrisome renal or adrenal mass. Stomach/Bowel: Marked amount of stool rectosigmoid region placing the patient at risk for stercoral colitis. Gas-filled ascending colon and proximal transverse colon. Lack of fat planes and under distension limited evaluation of small bowel and stomach. No obvious small bowel or gastric abnormality detected. Vascular/Lymphatic: Atherosclerotic changes abdominal aorta with ectasia. Maximal transverse dimension 2.7 x 2.4 cm. Atherosclerotic changes aortic branch vessels without large  vessel occlusion. Attenuated inferior mesenteric artery. No adenopathy. Reproductive: Prior hysterectomy.  No worrisome adnexal mass. Other: No free intraperitoneal air or bowel containing hernia. Musculoskeletal: Sacral decubitus extends to the lower sacrum/coccyx. Scoliosis thoracic and lumbar spine. IMPRESSION: No free intraperitoneal air. Gas filled prominent size ascending colon and proximal transverse colon. Prominent stool rectosigmoid region placing the patient at risk for stercoral colitis. Limited evaluation of stomach and small bowel secondary to under distension and lack of fat planes. No obvious gastric/small bowel abnormality. Sacral decubitus extends to the lower sacrum and coccyx.  Aortic Atherosclerosis (ICD10-I70.0). Abdominal aortic ectasia. Atherosclerotic changes aortic branch vessels with attenuated inferior mesenteric artery. Coronary artery calcifications. Electronically Signed   By: Genia Del M.D.   On: 10/03/2017 18:04   Dg Chest Port 1 View  Result Date: 10/03/2017 CLINICAL DATA:  Weakness.  History of dementia. EXAM: PORTABLE CHEST 1 VIEW COMPARISON:  09/27/2012.  04/22/2012. FINDINGS: Prior median sternotomy. Cardiomegaly with tortuosity of the great vessels again noted without interim change. No focal infiltrate. Low lung volumes with mild bibasilar subsegmental atelectasis. No prominent pleural effusion. No pneumothorax. Distended loops of bowel. Free air under the hemidiaphragm may be present. Abdominal series suggested for further evaluation. No acute bony abnormality. IMPRESSION: 1. Distended loops of bowel with possible free intraperitoneal air. Abdominal series suggested for further evaluation. 2. Prior CABG. Stable cardiomegaly and tortuous great vessels. Mild left base subsegmental atelectasis. Critical Value/emergent results were called by telephone at the time of interpretation on 10/03/2017 at 1:25 pm to Dr. Francine Graven , who verbally acknowledged these results. Electronically Signed   By: Marcello Moores  Register   On: 10/03/2017 13:28   Dg Abd 2 Views  Result Date: 10/03/2017 CLINICAL DATA:  Abnormal chest x-ray. EXAM: ABDOMEN - 2 VIEW COMPARISON:  Chest x-ray 10/03/2017 FINDINGS: There is no bowel dilatation to suggest obstruction. There is large relative lucency overlying the liver. On the decubitus view there is no free intraperitoneal air identified. There are no pathologic calcifications along the expected course of the ureters. The osseous structures are unremarkable. IMPRESSION: On left lateral decubitus view there is no definite free intraperitoneal air identified but there is a large relative lucency overlying the liver on the supine view and also on  the chest x-ray performed earlier same day which can be seen with free air secondary to perforated viscus. Given the discordant findings on the decubitus versus supine view, recommend further evaluation with a CT of the abdomen/pelvis. Electronically Signed   By: Kathreen Devoid   On: 10/03/2017 15:38    Scheduled Meds: . feeding supplement (ENSURE ENLIVE)  237 mL Oral BID BM  . sorbitol, milk of mag, mineral oil, glycerin (SMOG) enema  960 mL Rectal Once   Continuous Infusions: . sodium chloride 75 mL/hr at 10/04/17 1034  . cefTRIAXone (ROCEPHIN)  IV Stopped (10/04/17 1658)    Time spent: 35 minutes (> 50& of the time dedicated to face to face examination and discussion about code status, updates on plan of care and advance directives with family at bedside)    [facility] Pager [phone]. If 7PM-7AM, please contact night-coverage at [email or web address], password [facility] 10/04/2017, 5:45 PM  LOS: 0 days

## 2017-10-04 NOTE — Progress Notes (Signed)
Initial Nutrition Assessment  DOCUMENTATION CODES:   Severe malnutrition in context of chronic illness, Underweight  INTERVENTION:   Monitor PO intake to ensure needs are met.  NUTRITION DIAGNOSIS:   Severe Malnutrition related to chronic illness(severe dementia; relies on family to help eat) as evidenced by severe muscle depletion, severe fat depletion, and BMI of 12.44.  GOAL:   Patient will meet greater than or equal to 90% of their needs, Weight gain  MONITOR:   PO intake, Supplement acceptance, Weight trends, Labs, Skin  REASON FOR ASSESSMENT:   Malnutrition Screening Tool   ASSESSMENT:  84 y/o female with Hx of late onset dementia in Alzheimer's disease.  Admitted for urosepsis.  Presented with low BP, UTI, and being hypothermic (92 degrees).  MST score of 3.   Pt lives with and is taken care of by family.  Is bed bound and needs assistance eating and has decent appetite.  Pt is on a puree (dsy. 1) diet and is fed a variety of things by family: ensure supplement mixed with oatmeal, baby cereal, and/or peanut butter.  Other foods: Applesauce, baby foods mixed with added items (such as chicken and vegetables), creamed potatoes, and bananas.  According to female family member, assuming daughter, pt has about 2 ensures/day.  She says she drinks about 4 oz at a time with previously mentioned items mixed in.  Pt drinks her water, tea, and juice thickened.    Pt's current BW is 74lbs.  When asked about the pt's UBW, daughter wasn't sure, but said "normal, like 100-115".  When RD intern informed her of actual BW, she was extremely surprised that pt had that low of a BW.  She said she could tell that pt had become thinner, but didn't realize by how much.  Despite low BW, daughter seemed to be feeding pt a good variety of foods that are appropriate for her diet.  It's unclear if the amounts are meeting caloric needs, however, since BW has significantly decreased over the past 5.5 years  (last recorded BW: 155 lbs in Dec. 2013).   A full nutrition focused physical exam was not performed due to pt having bear hugger on to maintain temperature,but visible part of pt was visibly severely malnourished.  This is also clear in pt's BMI of 12.44.    Currently, pt's appetite is good, she was sent dysphagia 1 diet appropriate waffle, sausage, and eggs for breakfast and daughter said she ate it all.    Reviewed labs: Creatinine 0.35, glucose 107 Recent Labs  Lab 10/03/17 1336 10/04/17 0503  NA 137 137  K 3.9 3.8  CL 100* 105  CO2 30 27  BUN 10 7  CREATININE 0.46 0.35*  CALCIUM 9.3 8.6*  GLUCOSE 106* 107*   Medications:  Ensure enlive, saline, rocephin, cranberry extract  Past Medical History:  Diagnosis Date  . Dementia in Alzheimer's disease with late onset    NUTRITION - FOCUSED PHYSICAL EXAM:    Most Recent Value  Orbital Region  Severe depletion  Upper Arm Region  Severe depletion  Thoracic and Lumbar Region  Severe depletion  Buccal Region  Severe depletion  Temple Region  Severe depletion  Clavicle Bone Region  Severe depletion  Clavicle and Acromion Bone Region  Severe depletion  Scapular Bone Region  Severe depletion  Dorsal Hand  Severe depletion  Patellar Region  Severe depletion  Anterior Thigh Region  Severe depletion  Posterior Calf Region  Severe depletion  Hair  Reviewed  Diet Order:   Diet Order           DIET - DYS 1 Room service appropriate? Yes; Fluid consistency: Honey Thick  Diet effective now         EDUCATION NEEDS:   No education needs have been identified at this time  Skin:  Skin Assessment: Skin Integrity Issues: Skin Integrity Issues:: Stage II, Unstageable Stage II: Left shoulder: 5% red, 95% black. Peri-wound: intact. No drainage. Unstageable: mid sacrum: yellow and pink.  Scant drainage.  Last BM:  5/28  Height:   Ht Readings from Last 1 Encounters:  10/03/17 '5\' 5"'  (1.651 m)   Weight:   Wt Readings from  Last 1 Encounters:  10/04/17 74 lb 11.8 oz (33.9 kg)   Ideal Body Weight:  56.7 kg  BMI:  Body mass index is 12.44 kg/m.  Estimated Nutritional Needs:   Kcal:  1200-1400 kcal (35-40 kcal/kg BW)  Protein:  44-51 g (g/kg BW)  Fluid:  >/=1400 mL (>/= 39m/kcal)

## 2017-10-04 NOTE — Progress Notes (Signed)
Pt has not had any urine output since admit- This RN bladder scanned pt. >450 in bladder. Dr Manuella Ghazi paged and made aware. Waiting for orders/call back.

## 2017-10-05 ENCOUNTER — Encounter (HOSPITAL_COMMUNITY): Payer: Self-pay | Admitting: Primary Care

## 2017-10-05 DIAGNOSIS — Z515 Encounter for palliative care: Secondary | ICD-10-CM

## 2017-10-05 DIAGNOSIS — E860 Dehydration: Secondary | ICD-10-CM

## 2017-10-05 DIAGNOSIS — A4151 Sepsis due to Escherichia coli [E. coli]: Principal | ICD-10-CM

## 2017-10-05 DIAGNOSIS — T68XXXD Hypothermia, subsequent encounter: Secondary | ICD-10-CM

## 2017-10-05 DIAGNOSIS — Z7189 Other specified counseling: Secondary | ICD-10-CM

## 2017-10-05 LAB — BASIC METABOLIC PANEL
ANION GAP: 7 (ref 5–15)
BUN: 8 mg/dL (ref 6–20)
CHLORIDE: 107 mmol/L (ref 101–111)
CO2: 24 mmol/L (ref 22–32)
Calcium: 8.2 mg/dL — ABNORMAL LOW (ref 8.9–10.3)
Creatinine, Ser: 0.41 mg/dL — ABNORMAL LOW (ref 0.44–1.00)
GFR calc Af Amer: >60 mL/min (ref >60)
GFR calc non Af Amer: >60 mL/min (ref >60)
Glucose, Bld: 118 mg/dL — ABNORMAL HIGH (ref 65–99)
POTASSIUM: 3.2 mmol/L — AB (ref 3.5–5.1)
SODIUM: 138 mmol/L (ref 135–145)

## 2017-10-05 LAB — CBC
HCT: 35.5 % — ABNORMAL LOW (ref 36.0–46.0)
HEMOGLOBIN: 10.7 g/dL — AB (ref 12.0–15.0)
MCH: 27.1 pg (ref 26.0–34.0)
MCHC: 30.1 g/dL (ref 30.0–36.0)
MCV: 89.9 fL (ref 78.0–100.0)
Platelets: 265 10*3/uL (ref 150–400)
RBC: 3.95 MIL/uL (ref 3.87–5.11)
RDW: 13.4 % (ref 11.5–15.5)
WBC: 6.9 10*3/uL (ref 4.0–10.5)

## 2017-10-05 LAB — PROCALCITONIN: Procalcitonin: <0.1 ng/mL

## 2017-10-05 MED ORDER — DOCUSATE SODIUM 100 MG PO CAPS
100.0000 mg | ORAL_CAPSULE | Freq: Two times a day (BID) | ORAL | Status: DC
  Filled 2017-10-05: qty 1

## 2017-10-05 MED ORDER — POLYETHYLENE GLYCOL 3350 17 G PO PACK
17.0000 g | PACK | Freq: Every day | ORAL | Status: DC
  Administered 2017-10-05 – 2017-10-06 (×2): 17 g via ORAL
  Filled 2017-10-05 (×3): qty 1

## 2017-10-05 MED ORDER — POTASSIUM CHLORIDE 20 MEQ/15ML (10%) PO SOLN
40.0000 meq | Freq: Once | ORAL | Status: AC
  Administered 2017-10-05: 40 meq via ORAL
  Filled 2017-10-05: qty 30

## 2017-10-05 NOTE — Consult Note (Signed)
Consultation Note Date: 10/05/2017   Patient Name: Diana Byrd  DOB: Oct 11, 1923  MRN: [id number]  Age / Sex: 84 y.o., female  PCP: Redmond School, MD Referring Physician: Barton Dubois, MD  Reason for Consultation: Establishing goals of care  HPI/Patient Profile: 84 y.o. female  with past medical history of dementia, abdominal hysterectomy, cervical cancer, thyroid surgery, malnutrition, bedbound status, contracted, history of UTIs, admitted on 10/03/2017 with hypothermia, acute lower UTI.   Clinical Assessment and Goals of Care: Diana Byrd is resting quietly in bed.  She does not open her eyes or try to communicate with me in any meaningful way.  Present today at bedside is daughter Diana Byrd, and son Winefred Hillesheim.  We talked about Diana Byrd health history/psychosocial history.  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  She has been dependent on her family for care since 1999.  There is been a marked decline over the last year.  She lives with her daughter Diana Byrd in [facility].  Diana Byrd is also a caregiver for her disabled daughter.  Diana Byrd ask that we hold a family meeting later today when their sister Diana Byrd is able to be present.  "Hard choices for loving people" and "gone from my sight" booklets given. Conference with nursing staff related to plan of care.  Communication from nursing staff later in the day requesting family meeting at 1315.  Return to patient room for family meeting.  Present to his adult children Diana Byrd (Diana Byrd lives in [facility] home), daughter Diana Byrd, son Diana Byrd.  We talked about Mrs. Colston current physical state, we talked about her decline since her diagnosis of dementia.  Diana Byrd shares that when she realized her mother had "distracted disease" in she sat on the couch and just cried.  They share that Mrs.  Byrd has been on a [employer] since 2009.  It is been over 1 year since she has been able to stand, making transport to doctor's appointments very difficult.  It has been over 3 years since she has spoken, limited communication since 2010.  Family states that Mrs. Dohmen mother and 2 sisters also had dementia.  They shared that she spent about 18 months and [facility] after hospitalization.  Lengthy discussions with family related to goals of care, "what if's", how to make choices for loved ones.  We talked about healthcare power of attorney, see below.  We also talked about advanced directives, including the concept of "treat the treatable, but no extraordinary measures".  We also talked about the concept of "let nature take its course".  I give an example of when this would be appropriate stating that it is not illegal, or unethical, but we know some people have a moral problem with not doing everything.  We talked about the benefits of hospice in-home services.  Diana Byrd shares her concerns over feeding her mother, specialized diets.  We talked about the natural changes that occur in the body due to aging and disease.  We talked about what we can and cannot change.  We talked about how to make choices for loved ones including 1) keeping them at the center of decision-making 2) are we doing something for them, or to them (can we change what is happening) 3) what would the person Mrs. Kachmar was 20 years ago say about her current condition.  Family states that she always asked that they keep her at home.  Diana Byrd tells a story of a neighbor and how she chose to be at home for her death.  I share with family that due to Diana Byrd dementia, it is always recommended that if all possible she return to her own home.  Diana Byrd asks about prognosis.  Prognosis discussed with permission.  We talked about what is normal and expected including sleeping more, eating and interacting less.  Diana Byrd states  that the book "gone from my sight" was a real "eye-opener" for him.  He states that he has been struggling to accept his mother's decline and eventual death, but feels that this is inevitable.  I share what worries me most for Mrs. Blauvelt including her bedsore, and her inability to adequately intake food and liquid.  We talked about the revolving door of coming into the hospital for fluids, returning home to become dehydrated, returning to the hospital for fluids.  I share a diagram of the chronic illness pathway, what is normal and expected.   I ask family what worries them the most.  Diana Byrd states at first it was encouraging her mother to eat, but her true concern is pain related to bedsores.  Diana Byrd is unable to voice her main concern.  We talked about returning to the hospital.  I shared that at some point they may choose to not bring her back to the hospital (let nature take its course).  I share that if they choose comfort and dignity, they MUST have help at home with hospice.  We talked about how we will continue to care for Diana Byrd even if we are not trying to cure.  We talked about the benefits of hospice in-home including building a relationship with her trusted registered nurse, decreased need for going out for MD appointments for illness, help with bathing, all at no cost.  Healthcare power of attorney NEXT OF KIN -adult children including son Diana Byrd, daughters Diana Byrd and Diana Byrd make choices as a group.  Diana Byrd lost 1 son approximately 3 years ago.    SUMMARY OF RECOMMENDATIONS   At this point, continue to treat the treatable but no CPR, no intubation, no PEG tube.  Continue discussions related to the benefits of in-home hospice care.  Code Status/Advance Care Planning:  DNR -"allow a natural death" discussed with family.  We talked about treating the treatable, but no extraordinary measures.  They state they would not want a PEG tube for her.  We  also talked about the concept of "let nature take its course".  I give an example of when this would be appropriate.  I share that it is not illegal, or unethical, but we know some people have a moral problem with not doing everything.  I encouraged family that if they choose this for their mother, they would benefit from in-home hospice services.  Symptom Management:   Per hospitalist, no additional needs at this time.  Palliative Prophylaxis:   Aspiration, Palliative Wound Care and Turn Reposition  Additional Recommendations (Limitations, Scope, Preferences):  Continue to treat the  treatable but no CPR, no intubation, no PEG tube.  Psycho-social/Spiritual:   Desire for further Chaplaincy support:no  Additional Recommendations: Caregiving  Support/Resources and Education on Hospice  Prognosis:   < 6 months, or less would not be surprising based on functional status, frailty, unstageable sacral decubitus, BMI of 13, risk for aspiration, risk for malnutrition and dehydration related to pured diet/honey thick liquid.   Discharge Planning: Anticipate return home.  Question if with in-home hospice.      Primary Diagnoses: Present on Admission: . Hypothermia . Dementia . Acute lower UTI . SIRS (systemic inflammatory response syndrome) (HCC)   I have reviewed the medical record, interviewed the patient and family, and examined the patient. The following aspects are pertinent.  Past Medical History:  Diagnosis Date  . Dementia in Alzheimer's disease with late onset    Social History   Socioeconomic History  . Marital status: Widowed    Spouse name: Not on file  . Number of children: Not on file  . Years of education: Not on file  . Highest education level: Not on file  Occupational History  . Not on file  Social Needs  . Financial resource strain: Not on file  . Food insecurity:    Worry: Not on file    Inability: Not on file  . Transportation needs:    Medical:  Not on file    Non-medical: Not on file  Tobacco Use  . Smoking status: Never Smoker  . Smokeless tobacco: Never Used  Substance and Sexual Activity  . Alcohol use: No  . Drug use: No  . Sexual activity: Not on file  Lifestyle  . Physical activity:    Days per week: Not on file    Minutes per session: Not on file  . Stress: Not on file  Relationships  . Social connections:    Talks on phone: Not on file    Gets together: Not on file    Attends religious service: Not on file    Active member of club or organization: Not on file    Attends meetings of clubs or organizations: Not on file    Relationship status: Not on file  Other Topics Concern  . Not on file  Social History Narrative  . Not on file   History reviewed. No pertinent family history. Scheduled Meds: . docusate sodium  100 mg Oral BID  . feeding supplement (ENSURE ENLIVE)  237 mL Oral BID BM  . polyethylene glycol  17 g Oral Daily   Continuous Infusions: . sodium chloride 75 mL/hr at 10/04/17 2317  . cefTRIAXone (ROCEPHIN)  IV Stopped (10/04/17 1658)   PRN Meds:.ondansetron **OR** ondansetron (ZOFRAN) IV Medications Prior to Admission:  Prior to Admission medications   Medication Sig Start Date End Date Taking? Authorizing Provider  CRANBERRY EXTRACT PO Take 1 capsule by mouth daily. Opened and mixed in applesauce for administration   Yes [provider]  nystatin-triamcinolone (MYCOLOG II) cream Apply 1 application topically daily as needed (for irritation).   Yes [provider]   Allergies  Allergen Reactions  . Penicillins Rash    Has patient had a PCN reaction causing immediate rash, facial/tongue/throat swelling, SOB or lightheadedness with hypotension: Unknown Has patient had a PCN reaction causing severe rash involving mucus membranes or skin necrosis: Unknown Has patient had a PCN reaction that required hospitalization: Unknown Has patient had a PCN reaction occurring within the  last 10 years: Unknown If all of the above answers  are "NO", then may proceed with Cephalosporin use.    Review of Systems  Unable to perform ROS: Patient nonverbal    Physical Exam  Constitutional: No distress.  Spontaneously opens eyes at times, appears extremely frail, cachectic.  Chronically ill.  Total dependence for care  HENT:  Head: Atraumatic.  Temporal wasting  Cardiovascular: Normal rate.  Pulmonary/Chest: Effort normal. No respiratory distress.  Abdominal: Soft. She exhibits no distension.  Musculoskeletal: She exhibits deformity. She exhibits no edema.  BMI of 13, very frail, cachectic.  Contracted  Neurological: She is alert.  Spontaneously opens eyes at times, will briefly make eye contact, known dementia.  Skin: Skin is warm and dry.  Wounds as noted by nursing, left shoulder stage II, sacrum unstageable.  Psychiatric:  Does not appear to be fearful.  Nursing note and vitals reviewed.   Vital Signs: BP 119/73   Pulse (!) 29   Temp 97.9 F (36.6 C)   Resp 16   Ht 5\' 5"  (1.651 m)   Wt 36 kg (79 lb 5.9 oz)   SpO2 (!) 84%   BMI 13.21 kg/m  Pain Scale: PAINAD       SpO2: SpO2: (!) 84 % O2 Device:SpO2: (!) 84 % O2 Flow Rate: .   IO: Intake/output summary:   Intake/Output Summary (Last 24 hours) at 10/05/2017 1322 Last data filed at 10/05/2017 0500 Gross per 24 hour  Intake 120 ml  Output 600 ml  Net -480 ml    LBM: Last BM Date: 10/04/17 Baseline Weight: Weight: 45.4 kg (100 lb) Most recent weight: Weight: 36 kg (79 lb 5.9 oz)     Palliative Assessment/Data:   Flowsheet Rows     Most Recent Value  Intake Tab  Referral Department  Hospitalist  Unit at Time of Referral  Intermediate Care Unit  Palliative Care Primary Diagnosis  Neurology  Date Notified  10/04/17  Palliative Care Type  New Palliative care  Reason for referral  Clarify Goals of Care  Date of Admission  10/03/17  Date first seen by Palliative Care  10/05/17  # of days  Palliative referral response time  1 Day(s)  # of days IP prior to Palliative referral  1  Clinical Assessment  Palliative Performance Scale Score  10%  Pain Max last 24 hours  Not able to report  Pain Min Last 24 hours  Not able to report  Dyspnea Max Last 24 Hours  Not able to report  Dyspnea Min Last 24 hours  Not able to report  Psychosocial & Spiritual Assessment  Palliative Care Outcomes  Patient/Family meeting held?  Yes  Who was at the meeting?  Adult children at bedside  Palliative Care Outcomes  Provided end of life care assistance, Clarified goals of care, Counseled regarding hospice  Patient/Family wishes: Interventions discontinued/not started   Mechanical Ventilation      Time In:     1040 - 1315 Time Out:  1100 - 1500 Time Total:  20 + 105 = 125 minutes Greater than 50%  of this time was spent counseling and coordinating care related to the above assessment and plan.  Signed by: Drue Novel, NP   Please contact Palliative Medicine Team phone at [phone] for questions and concerns.  For individual provider: See Shea Evans

## 2017-10-05 NOTE — Consult Note (Addendum)
Reason for Consult:decubitus ulcers, left shoulder and sacrum Referring Physician: Dr. Shea Evans is an 84 y.o. female.  HPI: Patient is a 84 year old white female who was admitted to the hospital with urinary tract infection and was found to have decubitus ulcers on the left shoulder and sacrum.  Surgery was consulted for wound management.  Patient has dementia and is unable to provide a history.  History was obtained from the notes.  Past Medical History:  Diagnosis Date  . 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 ABDOMINAL HYSTERECTOMY    . cervical cancer    . THYROID SURGERY      History reviewed. No pertinent family history.  Social History:  reports that she has never smoked. She has never used smokeless tobacco. She reports that she does not drink alcohol or use drugs.  Allergies:  Allergies  Allergen Reactions  . Penicillins Rash    Has patient had a PCN reaction causing immediate rash, facial/tongue/throat swelling, SOB or lightheadedness with hypotension: Unknown Has patient had a PCN reaction causing severe rash involving mucus membranes or skin necrosis: Unknown Has patient had a PCN reaction that required hospitalization: Unknown Has patient had a PCN reaction occurring within the last 10 years: Unknown If all of the above answers are "NO", then may proceed with Cephalosporin use.     Medications: I have reviewed the patient's current medications.  Results for orders placed or performed during the hospital encounter of 10/03/17 (from the past 48 hour(s))  Wound or Superficial Culture     Status: None (Preliminary result)   Collection Time: 10/03/17 12:51 PM  Result Value Ref Range   Specimen Description      SACRAL Performed at [facility], [address], [place], [place] [address]    Special Requests      Normal Performed at [facility], [address], [place], [place]  [address]    Gram Stain      RARE WBC PRESENT, PREDOMINANTLY PMN ABUNDANT GRAM POSITIVE RODS ABUNDANT GRAM POSITIVE COCCI MODERATE GRAM NEGATIVE RODS    Culture      CULTURE REINCUBATED FOR BETTER GROWTH Performed at [facility], [place] [address] [place], [place] [address]    Report Status PENDING   Urinalysis, Routine w reflex microscopic     Status: Abnormal   Collection Time: 10/03/17 12:54 PM  Result Value Ref Range   Color, Urine YELLOW YELLOW   APPearance HAZY (A) CLEAR   Specific Gravity, Urine 1.013 1.005 - 1.030   pH 6.0 5.0 - 8.0   Glucose, UA NEGATIVE NEGATIVE mg/dL   Hgb urine dipstick NEGATIVE NEGATIVE   Bilirubin Urine NEGATIVE NEGATIVE   Ketones, ur NEGATIVE NEGATIVE mg/dL   Protein, ur NEGATIVE NEGATIVE mg/dL   Nitrite POSITIVE (A) NEGATIVE   Leukocytes, UA NEGATIVE NEGATIVE   RBC / HPF 0-5 0 - 5 RBC/hpf   WBC, UA 0-5 0 - 5 WBC/hpf   Bacteria, UA MANY (A) NONE SEEN   Squamous Epithelial / LPF 0-5 0 - 5    Comment: Performed at [facility], [address] [place],  [address]  Comprehensive metabolic panel     Status: Abnormal   Collection Time: 10/03/17  1:36 PM  Result Value Ref Range   Sodium 137 135 - 145 mmol/L   Potassium 3.9 3.5 - 5.1 mmol/L   Chloride 100 (L) 101 - 111 mmol/L  CO2 30 22 - 32 mmol/L   Glucose, Bld 106 (H) 65 - 99 mg/dL   BUN 10 6 - 20 mg/dL   Creatinine, Ser 0.46 0.44 - 1.00 mg/dL   Calcium 9.3 8.9 - 10.3 mg/dL   Total Protein 7.1 6.5 - 8.1 g/dL   Albumin 3.0 (L) 3.5 - 5.0 g/dL   AST 21 15 - 41 U/L   ALT 14 14 - 54 U/L   Alkaline Phosphatase 132 (H) 38 - 126 U/L   Total Bilirubin 0.5 0.3 - 1.2 mg/dL   GFR calc non Af Amer >60 >60 mL/min   GFR calc Af Amer >60 >60 mL/min    Comment: (NOTE) The eGFR has been calculated using the CKD EPI equation. This calculation has not been validated in all clinical situations. eGFR's persistently <60 mL/min signify possible Chronic Kidney Disease.    Anion gap 7 5 - 15     Comment: Performed at [facility], [address] [place], [place] [address]  Troponin I     Status: None   Collection Time: 10/03/17  1:36 PM  Result Value Ref Range   Troponin I <0.03 <0.03 ng/mL    Comment: Performed at [facility], [address] [place], [place] [address]  Lactic acid, plasma     Status: None   Collection Time: 10/03/17  1:36 PM  Result Value Ref Range   Lactic Acid, Venous 1.6 0.5 - 1.9 mmol/L    Comment: Performed at [facility], [address], [place], [place] [address]  CBC with Differential     Status: Abnormal   Collection Time: 10/03/17  1:36 PM  Result Value Ref Range   WBC 3.4 (L) 4.0 - 10.5 K/uL   RBC 4.35 3.87 - 5.11 MIL/uL   Hemoglobin 12.3 12.0 - 15.0 g/dL   HCT 39.2 36.0 - 46.0 %   MCV 90.1 78.0 - 100.0 fL   MCH 28.3 26.0 - 34.0 pg   MCHC 31.4 30.0 - 36.0 g/dL   RDW 13.3 11.5 - 15.5 %   Platelets 325 150 - 400 K/uL   Neutrophils Relative % 66 %   Neutro Abs 2.2 1.7 - 7.7 K/uL   Lymphocytes Relative 22 %   Lymphs Abs 0.8 0.7 - 4.0 K/uL   Monocytes Relative 10 %   Monocytes Absolute 0.3 0.1 - 1.0 K/uL   Eosinophils Relative 1 %   Eosinophils Absolute 0.0 0.0 - 0.7 K/uL   Basophils Relative 1 %   Basophils Absolute 0.0 0.0 - 0.1 K/uL    Comment: Performed at [facility], [address] [place], [place] [address]  Lactic acid, plasma     Status: Abnormal   Collection Time: 10/03/17  3:12 PM  Result Value Ref Range   Lactic Acid, Venous 2.3 (HH) 0.5 - 1.9 mmol/L    Comment: CRITICAL RESULT CALLED TO, READ BACK BY AND VERIFIED WITH: PATRAW,B ON 10/03/17 AT 9191 BY LOY,C Performed at [facility], [address] [place], [place] [address]   Culture, blood (Routine X 2) w Reflex to ID Panel     Status: None (Preliminary result)   Collection Time: 10/03/17  6:50 PM  Result Value Ref Range   Specimen Description BLOOD LEFT ARM    Special Requests      BOTTLES DRAWN AEROBIC ONLY Blood Culture adequate volume   Culture      NO GROWTH 2  DAYS Performed at [facility], [address] [place], [place] [address]  Report Status PENDING   Culture, blood (Routine X 2) w Reflex to ID Panel     Status: None (Preliminary result)   Collection Time: 10/03/17  7:03 PM  Result Value Ref Range   Specimen Description BLOOD LEFT HAND    Special Requests      BOTTLES DRAWN AEROBIC ONLY Blood Culture adequate volume   Culture      NO GROWTH 2 DAYS Performed at [facility], [address] [place], [place] [address]    Report Status PENDING   Procalcitonin - Baseline     Status: None   Collection Time: 10/03/17  7:16 PM  Result Value Ref Range   Procalcitonin <0.10 ng/mL    Comment:        Interpretation: PCT (Procalcitonin) <= 0.5 ng/mL: Systemic infection (sepsis) is not likely. Local bacterial infection is possible. (NOTE)       Sepsis PCT Algorithm           Lower Respiratory Tract                                      Infection PCT Algorithm    ----------------------------     ----------------------------         PCT < 0.25 ng/mL                PCT < 0.10 ng/mL         Strongly encourage             Strongly discourage   discontinuation of antibiotics    initiation of antibiotics    ----------------------------     -----------------------------       PCT 0.25 - 0.50 ng/mL            PCT 0.10 - 0.25 ng/mL               OR       >80% decrease in PCT            Discourage initiation of                                            antibiotics      Encourage discontinuation           of antibiotics    ----------------------------     -----------------------------         PCT >= 0.50 ng/mL              PCT 0.26 - 0.50 ng/mL               AND        <80% decrease in PCT             Encourage initiation of                                             antibiotics       Encourage continuation           of antibiotics    ----------------------------     -----------------------------        PCT >= 0.50 ng/mL  PCT  > 0.50 ng/mL               AND         increase in PCT                  Strongly encourage                                      initiation of antibiotics    Strongly encourage escalation           of antibiotics                                     -----------------------------                                           PCT <= 0.25 ng/mL                                                 OR                                        > 80% decrease in PCT                                     Discontinue / Do not initiate                                             antibiotics Performed at [facility], [address] [place], [place] [address]   Lactic acid, plasma     Status: Abnormal   Collection Time: 10/03/17  7:16 PM  Result Value Ref Range   Lactic Acid, Venous 2.1 (HH) 0.5 - 1.9 mmol/L    Comment: CRITICAL RESULT CALLED TO, READ BACK BY AND VERIFIED WITH: SAPPLET,J @ 2026 ON 5.28.19 BY BOWMAN,L Performed at [facility], [address], [place], [place] [address]   MRSA PCR Screening     Status: None   Collection Time: 10/03/17  8:40 PM  Result Value Ref Range   MRSA by PCR NEGATIVE NEGATIVE    Comment:        The GeneXpert MRSA Assay (FDA approved for NASAL specimens only), is one component of a comprehensive MRSA colonization surveillance program. It is not intended to diagnose MRSA infection nor to guide or monitor treatment for MRSA infections. Performed at [facility], [address], [place], [place] [address]   Lactic acid, plasma     Status: None   Collection Time: 10/03/17 10:38 PM  Result Value Ref Range   Lactic Acid, Venous 1.4 0.5 - 1.9 mmol/L    Comment: Performed at [facility], [address], [place],  [address]  Basic metabolic panel     Status: Abnormal   Collection Time:  10/04/17  5:03 AM  Result Value Ref Range   Sodium 137 135 - 145 mmol/L   Potassium 3.8 3.5 - 5.1 mmol/L   Chloride 105 101 - 111 mmol/L   CO2 27 22 - 32 mmol/L   Glucose, Bld  107 (H) 65 - 99 mg/dL   BUN 7 6 - 20 mg/dL   Creatinine, Ser 0.35 (L) 0.44 - 1.00 mg/dL   Calcium 8.6 (L) 8.9 - 10.3 mg/dL   GFR calc non Af Amer >60 >60 mL/min   GFR calc Af Amer >60 >60 mL/min    Comment: (NOTE) The eGFR has been calculated using the CKD EPI equation. This calculation has not been validated in all clinical situations. eGFR's persistently <60 mL/min signify possible Chronic Kidney Disease.    Anion gap 5 5 - 15    Comment: Performed at [facility], [address], [place], [place] [address]  CBC     Status: Abnormal   Collection Time: 10/04/17  5:03 AM  Result Value Ref Range   WBC 4.5 4.0 - 10.5 K/uL   RBC 3.89 3.87 - 5.11 MIL/uL   Hemoglobin 11.1 (L) 12.0 - 15.0 g/dL   HCT 35.0 (L) 36.0 - 46.0 %   MCV 90.0 78.0 - 100.0 fL   MCH 28.5 26.0 - 34.0 pg   MCHC 31.7 30.0 - 36.0 g/dL   RDW 13.4 11.5 - 15.5 %   Platelets 300 150 - 400 K/uL    Comment: Performed at [facility], [address] [place], [place] [address]  Procalcitonin     Status: None   Collection Time: 10/04/17  5:03 AM  Result Value Ref Range   Procalcitonin <0.10 ng/mL    Comment:        Interpretation: PCT (Procalcitonin) <= 0.5 ng/mL: Systemic infection (sepsis) is not likely. Local bacterial infection is possible. (NOTE)       Sepsis PCT Algorithm           Lower Respiratory Tract                                      Infection PCT Algorithm    ----------------------------     ----------------------------         PCT < 0.25 ng/mL                PCT < 0.10 ng/mL         Strongly encourage             Strongly discourage   discontinuation of antibiotics    initiation of antibiotics    ----------------------------     -----------------------------       PCT 0.25 - 0.50 ng/mL            PCT 0.10 - 0.25 ng/mL               OR       >80% decrease in PCT            Discourage initiation of                                            antibiotics      Encourage discontinuation           of  antibiotics    ----------------------------     -----------------------------  PCT >= 0.50 ng/mL              PCT 0.26 - 0.50 ng/mL               AND        <80% decrease in PCT             Encourage initiation of                                             antibiotics       Encourage continuation           of antibiotics    ----------------------------     -----------------------------        PCT >= 0.50 ng/mL                  PCT > 0.50 ng/mL               AND         increase in PCT                  Strongly encourage                                      initiation of antibiotics    Strongly encourage escalation           of antibiotics                                     -----------------------------                                           PCT <= 0.25 ng/mL                                                 OR                                        > 80% decrease in PCT                                     Discontinue / Do not initiate                                             antibiotics Performed at [facility], [address], [place], [place] [address]   Procalcitonin     Status: None   Collection Time: 10/05/17  5:40 AM  Result Value Ref Range   Procalcitonin <0.10 ng/mL    Comment:        Interpretation: PCT (Procalcitonin) <= 0.5 ng/mL: Systemic infection (sepsis) is not likely. Local bacterial infection is possible. (  NOTE)       Sepsis PCT Algorithm           Lower Respiratory Tract                                      Infection PCT Algorithm    ----------------------------     ----------------------------         PCT < 0.25 ng/mL                PCT < 0.10 ng/mL         Strongly encourage             Strongly discourage   discontinuation of antibiotics    initiation of antibiotics    ----------------------------     -----------------------------       PCT 0.25 - 0.50 ng/mL            PCT 0.10 - 0.25 ng/mL               OR       >80% decrease in PCT             Discourage initiation of                                            antibiotics      Encourage discontinuation           of antibiotics    ----------------------------     -----------------------------         PCT >= 0.50 ng/mL              PCT 0.26 - 0.50 ng/mL               AND        <80% decrease in PCT             Encourage initiation of                                             antibiotics       Encourage continuation           of antibiotics    ----------------------------     -----------------------------        PCT >= 0.50 ng/mL                  PCT > 0.50 ng/mL               AND         increase in PCT                  Strongly encourage                                      initiation of antibiotics    Strongly encourage escalation           of antibiotics                                     -----------------------------  PCT <= 0.25 ng/mL                                                 OR                                        > 80% decrease in PCT                                     Discontinue / Do not initiate                                             antibiotics Performed at [facility], [address] [place], [place] [address]   CBC     Status: Abnormal   Collection Time: 10/05/17  5:40 AM  Result Value Ref Range   WBC 6.9 4.0 - 10.5 K/uL   RBC 3.95 3.87 - 5.11 MIL/uL   Hemoglobin 10.7 (L) 12.0 - 15.0 g/dL   HCT 35.5 (L) 36.0 - 46.0 %   MCV 89.9 78.0 - 100.0 fL   MCH 27.1 26.0 - 34.0 pg   MCHC 30.1 30.0 - 36.0 g/dL   RDW 13.4 11.5 - 15.5 %   Platelets 265 150 - 400 K/uL    Comment: Performed at [facility], [address] [place], [place] [address]  Basic metabolic panel     Status: Abnormal   Collection Time: 10/05/17  5:40 AM  Result Value Ref Range   Sodium 138 135 - 145 mmol/L   Potassium 3.2 (L) 3.5 - 5.1 mmol/L   Chloride 107 101 - 111 mmol/L   CO2 24 22 - 32 mmol/L   Glucose, Bld 118 (H) 65 - 99  mg/dL   BUN 8 6 - 20 mg/dL   Creatinine, Ser 0.41 (L) 0.44 - 1.00 mg/dL   Calcium 8.2 (L) 8.9 - 10.3 mg/dL   GFR calc non Af Amer >60 >60 mL/min   GFR calc Af Amer >60 >60 mL/min    Comment: (NOTE) The eGFR has been calculated using the CKD EPI equation. This calculation has not been validated in all clinical situations. eGFR's persistently <60 mL/min signify possible Chronic Kidney Disease.    Anion gap 7 5 - 15    Comment: Performed at [facility], [address], [place], [place] [address]    Ct Abdomen Pelvis W Contrast  Result Date: 10/03/2017 CLINICAL DATA:  84 year old female. Question abnormal chest x-ray/abdominal films. Dementia. Post hysterectomy. Bed sores. Subsequent encounter. EXAM: CT ABDOMEN AND PELVIS WITH CONTRAST TECHNIQUE: Multidetector CT imaging of the abdomen and pelvis was performed using the standard protocol following bolus administration of intravenous contrast. CONTRAST:  68m ISOVUE-300 IOPAMIDOL (ISOVUE-300) INJECTION 61% COMPARISON:  Abdominal film chest x-ray 10/03/2017. 09/27/2012 and 06/21/2012 CT abdomen and pelvis. FINDINGS: Lower chest: Basilar subsegmental atelectasis/scarring. Minimal nodular density right middle lobe stable. Mild cardiomegaly. Coronary artery calcifications. Mild prominence pulmonary artery. Hepatobiliary: No worrisome hepatic lesion. No calcified gallstones. Pancreas: No pancreatic mass or inflammation. Spleen: No splenic mass or enlargement. Adrenals/Urinary Tract: No  obstructing stone. Extra renal pelvis without significant change. Tiny renal cysts without worrisome renal or adrenal mass. Stomach/Bowel: Marked amount of stool rectosigmoid region placing the patient at risk for stercoral colitis. Gas-filled ascending colon and proximal transverse colon. Lack of fat planes and under distension limited evaluation of small bowel and stomach. No obvious small bowel or gastric abnormality detected. Vascular/Lymphatic: Atherosclerotic changes  abdominal aorta with ectasia. Maximal transverse dimension 2.7 x 2.4 cm. Atherosclerotic changes aortic branch vessels without large vessel occlusion. Attenuated inferior mesenteric artery. No adenopathy. Reproductive: Prior hysterectomy.  No worrisome adnexal mass. Other: No free intraperitoneal air or bowel containing hernia. Musculoskeletal: Sacral decubitus extends to the lower sacrum/coccyx. Scoliosis thoracic and lumbar spine. IMPRESSION: No free intraperitoneal air. Gas filled prominent size ascending colon and proximal transverse colon. Prominent stool rectosigmoid region placing the patient at risk for stercoral colitis. Limited evaluation of stomach and small bowel secondary to under distension and lack of fat planes. No obvious gastric/small bowel abnormality. Sacral decubitus extends to the lower sacrum and coccyx. Aortic Atherosclerosis (ICD10-I70.0). Abdominal aortic ectasia. Atherosclerotic changes aortic branch vessels with attenuated inferior mesenteric artery. Coronary artery calcifications. Electronically Signed   By: Genia Del M.D.   On: 10/03/2017 18:04   Dg Chest Port 1 View  Result Date: 10/03/2017 CLINICAL DATA:  Weakness.  History of dementia. EXAM: PORTABLE CHEST 1 VIEW COMPARISON:  09/27/2012.  04/22/2012. FINDINGS: Prior median sternotomy. Cardiomegaly with tortuosity of the great vessels again noted without interim change. No focal infiltrate. Low lung volumes with mild bibasilar subsegmental atelectasis. No prominent pleural effusion. No pneumothorax. Distended loops of bowel. Free air under the hemidiaphragm may be present. Abdominal series suggested for further evaluation. No acute bony abnormality. IMPRESSION: 1. Distended loops of bowel with possible free intraperitoneal air. Abdominal series suggested for further evaluation. 2. Prior CABG. Stable cardiomegaly and tortuous great vessels. Mild left base subsegmental atelectasis. Critical Value/emergent results were called by  telephone at the time of interpretation on 10/03/2017 at 1:25 pm to Dr. Francine Graven , who verbally acknowledged these results. Electronically Signed   By: Marcello Moores  Register   On: 10/03/2017 13:28   Dg Abd 2 Views  Result Date: 10/03/2017 CLINICAL DATA:  Abnormal chest x-ray. EXAM: ABDOMEN - 2 VIEW COMPARISON:  Chest x-ray 10/03/2017 FINDINGS: There is no bowel dilatation to suggest obstruction. There is large relative lucency overlying the liver. On the decubitus view there is no free intraperitoneal air identified. There are no pathologic calcifications along the expected course of the ureters. The osseous structures are unremarkable. IMPRESSION: On left lateral decubitus view there is no definite free intraperitoneal air identified but there is a large relative lucency overlying the liver on the supine view and also on the chest x-ray performed earlier same day which can be seen with free air secondary to perforated viscus. Given the discordant findings on the decubitus versus supine view, recommend further evaluation with a CT of the abdomen/pelvis. Electronically Signed   By: Kathreen Devoid   On: 10/03/2017 15:38    ROS:  Review of systems not obtained due to patient factors.  Blood pressure (!) 96/56, pulse (!) 58, temperature 98.6 F (37 C), resp. rate 13, height '5\' 5"'  (1.651 m), weight 79 lb 5.9 oz (36 kg), SpO2 100 %. Physical Exam: Cachectic black female who is not too responsive Head is normocephalic, atraumatic Lungs clear to auscultation with equal breath sounds bilaterally Heart examination reveals regular rate and rhythm without S3, S4, murmurs Skin examination  reveals a 2 to 3 cm partial-thickness pressure ulceration.  This is stage II.  Does not appear to be infected.  No muscle or bone exposed.  Sacral examination reveals a 5 cm sacral decubitus ulcer which is not full-thickness.  No bone is exposed.  Serous drainage noted.  Not purulent in nature.  Small area of superficial eschar  which is easily removable.  Assessment/Plan: Impression: Stage II decubitus ulcers of left shoulder and sacrum.  As both areas appear to be without significant infection, would not debride at this time.  Continue DuoDERM and avoiding pressure in these regions.  Please call me if I can be of further assistance.  Prognosis is poor for healing of these wounds due to her bedridden state and malnutrition.  Aviva Signs 10/05/2017, 8:03 AM

## 2017-10-05 NOTE — Progress Notes (Signed)
[facility] PROGRESS NOTE  REBAKAH COKLEY ZOX:[id number] DOB: 1924-03-28 DOA: 10/03/2017 PCP: Redmond School, MD   Interim summary and HPI 84 y.o. female with medical history significant of advanced dementia who is nonverbal in bedbound went to see her primary care physician today because of vomiting and developing a bedsore.  Family takes care of her at home.  She has not had any fevers.  No diarrhea.  She did vomit once and that was all.  She is not exhibiting any signs of pain.  Family denies any fevers.  Patient was brought to the emergency department from primary care physician office for low blood pressure.  She is found to have a UTI with a temperature of 92 and was referred for admission for urosepsis.  All history was obtained from daughter at bedside as the patient is nonverbal.  Assessment/Plan: 1-sepsis due to E. coli UTI: Also with concern of potential ongoing infection in her decubitus ulcer. -Continue IV Rocephin -Follow final cultures/sensitivity results. -Continue IV fluids -Continue to follow clinical response and recommendations/outcome from goals of care meeting with palliative care. -Temperature has a stabilized without the need for bear hugger -Patient will be transfer to MedSurg bed.  2-dementia -continue supportive care -no behavioral disturbances   3-sigmoid-rectal stool burden -Excellent response to smog enema -Start Colace and MiraLAX -Continue adequate hydration  4-dysphagia -continue dysphagia 1 diet and honey thick liquids  -Associated with patient's underlying dementia and high risk for aspiration with any other type of diet consistency.  5-severe protein calorie malnutrition  -feeding supplements as recommended by dietitian  -Body mass index is 13.21 kg/m.  -Patient on dysphagia 1 with honey thick liquids and overall no capable of eating too much.  6-pressure injury: stage 2 on left shoulder and unstageable on her sacrum area -Appreciate  general surgery recommendation -No surgical debridement anticipated at this time -Continue preventive measure, wound care and constant repositioning.  7-hypokalemia -Will replete potassium follow electrolytes trend.  Code Status: DNR Family Communication: Son and daughter at bedside Disposition Plan: Continue IV antibiotics, appreciate general surgery recommendations (not looking for debridement at this moment), continue preventive measures on her decubitus/pressure injuries, follow goals of care meeting results.  Patient will be transferred out of the unit will follow cultures results hopefully home in the next 24 to 48 hours.   Consultants:  Palliative care  General surgery  Procedures:  See below for x-ray reports.  Antibiotics:  Rocephin  10/03/17  HPI/Subjective: No major distress, case discussed with family at bedside, per the report patient doing better overall.  No longer requiring bear hugger and has a really good bowel movement after the enema was given last night.  Objective: Vitals:   10/05/17 1200 10/05/17 1300  BP: (!) 140/97 119/73  Pulse:    Resp: 17 16  Temp: (!) 97.5 F (36.4 C) 97.9 F (36.6 C)  SpO2:      Intake/Output Summary (Last 24 hours) at 10/05/2017 1414 Last data filed at 10/05/2017 1300 Gross per 24 hour  Intake 1995 ml  Output 600 ml  Net 1395 ml   Filed Weights   10/03/17 2102 10/04/17 0500 10/05/17 0500  Weight: 33.9 kg (74 lb 11.8 oz) 33.9 kg (74 lb 11.8 oz) 36 kg (79 lb 5.9 oz)    Exam:   General: Chronically ill in appearance, very frail, significantly underweight and in no acute distress.  Patient feeling well and without the use of bear hugger.  Per nursing and family reports great  response to the use of small enema.  Cardiovascular: Z6-X0, positive systolic ejection murmur, no rubs, no gallops, no JVD.  Respiratory: Good air movement bilaterally, no wheezing, no crackles, no using accessory muscles.  Abdomen: Soft,  nontender, nondistended, positive bowel sounds.  Musculoskeletal: Bilaterally symmetrical muscular atrophy appreciated in 4 limbs, positive contracture especially affecting her upper extremity (left more than right), no cyanosis, no clubbing.  Skin: Stage II pressure injury on her left shoulder (no drainage appreciated), unstageable decubitus ulcer with yellowish drainage and some discomfort for appreciated by body gestures when palpating on the wound.  No odor.  Data Reviewed: Basic Metabolic Panel: Recent Labs  Lab 10/03/17 1336 10/04/17 0503 10/05/17 0540  NA 137 137 138  K 3.9 3.8 3.2*  CL 100* 105 107  CO2 30 27 24   GLUCOSE 106* 107* 118*  BUN 10 7 8   CREATININE 0.46 0.35* 0.41*  CALCIUM 9.3 8.6* 8.2*   Liver Function Tests: Recent Labs  Lab 10/03/17 1336  AST 21  ALT 14  ALKPHOS 132*  BILITOT 0.5  PROT 7.1  ALBUMIN 3.0*   CBC: Recent Labs  Lab 10/03/17 1336 10/04/17 0503 10/05/17 0540  WBC 3.4* 4.5 6.9  NEUTROABS 2.2  --   --   HGB 12.3 11.1* 10.7*  HCT 39.2 35.0* 35.5*  MCV 90.1 90.0 89.9  PLT 325 300 265   Cardiac Enzymes: Recent Labs  Lab 10/03/17 1336  TROPONINI <0.03    Recent Results (from the past 240 hour(s))  Wound or Superficial Culture     Status: Abnormal (Preliminary result)   Collection Time: 10/03/17 12:51 PM  Result Value Ref Range Status   Specimen Description   Final    SACRAL Performed at [facility], [address], [place], [place] [address]    Special Requests   Final    Normal Performed at [facility], [address] [place], [place] [address]    Gram Stain   Final    RARE WBC PRESENT, PREDOMINANTLY PMN ABUNDANT GRAM POSITIVE RODS ABUNDANT GRAM POSITIVE COCCI MODERATE GRAM NEGATIVE RODS Performed at [facility], [place] [address] [place], [place] [address]    Culture MULTIPLE ORGANISMS PRESENT, NONE PREDOMINANT (A)  Final   Report Status PENDING  Incomplete  Urine culture     Status: Abnormal (Preliminary  result)   Collection Time: 10/03/17 12:54 PM  Result Value Ref Range Status   Specimen Description   Final    URINE, RANDOM Performed at [facility], [address], [place], [place] [address]    Special Requests   Final    URINE, RANDOM Performed at [facility], [address], [place], [place] [address]    Culture (A)  Final    >=100,000 COLONIES/mL ESCHERICHIA COLI SUSCEPTIBILITIES TO FOLLOW Performed at [facility], [place] [address], [place], [place] [address]    Report Status PENDING  Incomplete  Culture, blood (Routine X 2) w Reflex to ID Panel     Status: None (Preliminary result)   Collection Time: 10/03/17  6:50 PM  Result Value Ref Range Status   Specimen Description BLOOD LEFT ARM  Final   Special Requests   Final    BOTTLES DRAWN AEROBIC ONLY Blood Culture adequate volume   Culture   Final    NO GROWTH 2 DAYS Performed at [facility], [address], [place], [place] [address]    Report Status PENDING  Incomplete  Culture, blood (Routine X 2) w Reflex to  ID Panel     Status: None (Preliminary result)   Collection Time: 10/03/17  7:03 PM  Result Value Ref Range Status   Specimen Description BLOOD LEFT HAND  Final   Special Requests   Final    BOTTLES DRAWN AEROBIC ONLY Blood Culture adequate volume   Culture   Final    NO GROWTH 2 DAYS Performed at [facility], [address] [place], [place] [address]    Report Status PENDING  Incomplete  MRSA PCR Screening     Status: None   Collection Time: 10/03/17  8:40 PM  Result Value Ref Range Status   MRSA by PCR NEGATIVE NEGATIVE Final    Comment:        The GeneXpert MRSA Assay (FDA approved for NASAL specimens only), is one component of a comprehensive MRSA colonization surveillance program. It is not intended to diagnose MRSA infection nor to guide or monitor treatment for MRSA infections. Performed at [facility], [address], [place], [place] [address]      Studies: Ct Abdomen Pelvis W  Contrast  Result Date: 10/03/2017 CLINICAL DATA:  84 year old female. Question abnormal chest x-ray/abdominal films. Dementia. Post hysterectomy. Bed sores. Subsequent encounter. EXAM: CT ABDOMEN AND PELVIS WITH CONTRAST TECHNIQUE: Multidetector CT imaging of the abdomen and pelvis was performed using the standard protocol following bolus administration of intravenous contrast. CONTRAST:  45mL ISOVUE-300 IOPAMIDOL (ISOVUE-300) INJECTION 61% COMPARISON:  Abdominal film chest x-ray 10/03/2017. 09/27/2012 and 06/21/2012 CT abdomen and pelvis. FINDINGS: Lower chest: Basilar subsegmental atelectasis/scarring. Minimal nodular density right middle lobe stable. Mild cardiomegaly. Coronary artery calcifications. Mild prominence pulmonary artery. Hepatobiliary: No worrisome hepatic lesion. No calcified gallstones. Pancreas: No pancreatic mass or inflammation. Spleen: No splenic mass or enlargement. Adrenals/Urinary Tract: No obstructing stone. Extra renal pelvis without significant change. Tiny renal cysts without worrisome renal or adrenal mass. Stomach/Bowel: Marked amount of stool rectosigmoid region placing the patient at risk for stercoral colitis. Gas-filled ascending colon and proximal transverse colon. Lack of fat planes and under distension limited evaluation of small bowel and stomach. No obvious small bowel or gastric abnormality detected. Vascular/Lymphatic: Atherosclerotic changes abdominal aorta with ectasia. Maximal transverse dimension 2.7 x 2.4 cm. Atherosclerotic changes aortic branch vessels without large vessel occlusion. Attenuated inferior mesenteric artery. No adenopathy. Reproductive: Prior hysterectomy.  No worrisome adnexal mass. Other: No free intraperitoneal air or bowel containing hernia. Musculoskeletal: Sacral decubitus extends to the lower sacrum/coccyx. Scoliosis thoracic and lumbar spine. IMPRESSION: No free intraperitoneal air. Gas filled prominent size ascending colon and proximal  transverse colon. Prominent stool rectosigmoid region placing the patient at risk for stercoral colitis. Limited evaluation of stomach and small bowel secondary to under distension and lack of fat planes. No obvious gastric/small bowel abnormality. Sacral decubitus extends to the lower sacrum and coccyx. Aortic Atherosclerosis (ICD10-I70.0). Abdominal aortic ectasia. Atherosclerotic changes aortic branch vessels with attenuated inferior mesenteric artery. Coronary artery calcifications. Electronically Signed   By: Genia Del M.D.   On: 10/03/2017 18:04   Dg Abd 2 Views  Result Date: 10/03/2017 CLINICAL DATA:  Abnormal chest x-ray. EXAM: ABDOMEN - 2 VIEW COMPARISON:  Chest x-ray 10/03/2017 FINDINGS: There is no bowel dilatation to suggest obstruction. There is large relative lucency overlying the liver. On the decubitus view there is no free intraperitoneal air identified. There are no pathologic calcifications along the expected course of the ureters. The osseous structures are unremarkable. IMPRESSION: On left lateral decubitus view there is no definite free intraperitoneal air identified but there  is a large relative lucency overlying the liver on the supine view and also on the chest x-ray performed earlier same day which can be seen with free air secondary to perforated viscus. Given the discordant findings on the decubitus versus supine view, recommend further evaluation with a CT of the abdomen/pelvis. Electronically Signed   By: Kathreen Devoid   On: 10/03/2017 15:38    Scheduled Meds: . docusate sodium  100 mg Oral BID  . feeding supplement (ENSURE ENLIVE)  237 mL Oral BID BM  . polyethylene glycol  17 g Oral Daily   Continuous Infusions: . sodium chloride 75 mL/hr at 10/05/17 1330  . cefTRIAXone (ROCEPHIN)  IV Stopped (10/04/17 1658)    Time spent: 30 minutes    [facility] Pager [phone]. If 7PM-7AM, please contact night-coverage at [email or web address], password  [facility] 10/05/2017, 2:14 PM  LOS: 1 day

## 2017-10-06 LAB — AEROBIC CULTURE W GRAM STAIN (SUPERFICIAL SPECIMEN): Special Requests: NORMAL

## 2017-10-06 LAB — URINE CULTURE: Culture: >=100000 — AB

## 2017-10-06 LAB — AEROBIC CULTURE  (SUPERFICIAL SPECIMEN): CULTURE: NORMAL

## 2017-10-06 MED ORDER — RESOURCE THICKENUP CLEAR PO POWD
ORAL | Status: DC | PRN
  Filled 2017-10-06: qty 125

## 2017-10-06 MED ORDER — AMOXICILLIN-POT CLAVULANATE 250-62.5 MG/5ML PO SUSR: 250.0000 mg | Freq: Two times a day (BID) | ORAL | Status: DC

## 2017-10-06 MED ORDER — STARCH (THICKENING) PO POWD
ORAL | Status: DC | PRN
  Administered 2017-10-06: 10:00:00 via ORAL

## 2017-10-06 MED ORDER — BISACODYL 5 MG PO TBEC
5.0000 mg | DELAYED_RELEASE_TABLET | Freq: Every day | ORAL | Status: DC
  Administered 2017-10-07: 5 mg via ORAL
  Filled 2017-10-06: qty 1

## 2017-10-06 NOTE — Care Management Important Message (Signed)
Important Message  Patient Details  Name: Diana Byrd MRN: [id number] Date of Birth: May 01, 1924   Medicare Important Message Given:  Yes    Shelda Altes 10/06/2017, 12:30 PM

## 2017-10-06 NOTE — Care Management (Signed)
Patient Information   Patient Name Diana Byrd, Diana Byrd ([id number]) Sex Female DOB March 28, 1924  Room Bed  A315 A315-01  Patient Demographics   Address [place] [address] Phone [phone] (Home)  Patient Ethnicity & Race   Ethnic Group Patient Race  Not Hispanic or Latino Black or African American  Emergency Contact(s)   Name Relation Home Work Mobile  Davis,Velma Daughter [phone]    Cherene Julian Daughter [phone]    Documents on File    Status Date Received Description  Documents for the Patient  [place] Received 06/30/10   [place] E-Signature HIPAA Notice of Privacy Signed 03/01/16   [place] E-Signature HIPAA Notice of Privacy Spanish Not Received    Driver's License Not Received    Advance Directives/Living Will/HCPOA/POA Not Received    [place] Not Received    Insurance Card Not Received    Other Photo ID Not Received    Release of Information Not Received    Advanced Beneficiary Notice (ABN) Not Received    E-Signature AOB Spanish Not Received    Documents for the Encounter  AOB (Assignment of Insurance Benefits) Not Received    E-signature AOB Signed 10/03/17   MEDICARE RIGHTS Not Received    E-signature Medicare Rights Signed 10/03/17   ED Patient Billing Extract   ED PB Billing Extract  Cardiac Monitoring Strip Received 10/04/17   Cardiac Monitoring Strip Shift Summary Received 10/04/17   EKG Received 10/04/17   Admission Information   Attending Provider Admitting Provider Admission Type Admission Date/Time  Barton Dubois, MD Phillips Grout, MD Emergency 10/03/17 1225  Discharge Date Hospital Service Auth/Cert Status Service Area   Internal Medicine Incomplete [facility]  Unit Room/Bed Admission Status   AP-DEPT 300 A315/A315-01 Admission (Confirmed)   Admission   Complaint  bedsore weakness  Hospital Account   Name Acct ID  Class Status Primary Coverage  [place], Naser [id number] Inpatient Open MEDICARE - MEDICARE PART A AND B      Guarantor Account (for Hospital Account [id number])   Name Relation to Pt Service Area Active? Acct Type  Celine Ahr Self [facility]SA Yes Personal/Family  Address Phone    [address] [place], [place] [address] [phone])        Coverage Information (for Hospital Account [id number])   F/O Payor/Plan Precert #  MEDICARE/MEDICARE PART A AND B   Subscriber Subscriber #  [place]anda, Moger [id number]  Address Phone  [address] [place], [place] [address]

## 2017-10-06 NOTE — Care Management (Addendum)
Receiving multiple calls, family wants to know when patient will be discharged and how she will get home and plan Notified attending via text. Per attending patient will be going home with hospice.  Called daughter Suzi Roots- She reports that no family will be at home to receive patient until 5 pm.  EMS transport will be needed. Diana Byrd made aware that EMS will not transport between the hours of 5-7 pm.   Asked about hospice referral, Diana Byrd states she spoke with palliative NP yesterday. No Hospice choice had been made.  Offered choice. Diana Byrd elects [facility]. Diana Byrd very worried that hospice will not be available to see patient over the weekend. CM placed call to [facility] to verify is they can do a late intake tonight or over the weekend being that this is such a late referral on a Friday.   ADDENDUM: 3009: Spoke with Horris Latino of [facility].  Intake can be done tomorrow if patient is indeed accepted to hospice services. Fully anticipate patient will be accepted, just waiting on patient to be reviewed by supervisior Olean Ree. Discussed with attending. Due to intake being tomorrow and equipment delivery, patient will DC tomorrow.  Family updated.  Patient will need EMS transport arranged tomorrow .

## 2017-10-06 NOTE — Progress Notes (Signed)
[facility] PROGRESS NOTE  Diana Byrd:[id number] DOB: [birth year] DOA: 10/03/2017 PCP: Redmond School, MD   Interim summary and HPI 84 y.o. female with medical history significant of advanced dementia who is nonverbal in bedbound went to see her primary care physician today because of vomiting and developing a bedsore.  Family takes care of her at home.  She has not had any fevers.  No diarrhea.  She did vomit once and that was all.  She is not exhibiting any signs of pain.  Family denies any fevers.  Patient was brought to the emergency department from primary care physician office for low blood pressure.  She is found to have a UTI with a temperature of 92 and was referred for admission for urosepsis.  All history was obtained from daughter at bedside as the patient is nonverbal.  Assessment/Plan: 1-sepsis due to E. coli UTI: Also with concern of potential ongoing infection in her decubitus ulcer. -sepsis features essentially resolved -will switch antibiotics to PO; will use augmentin; as can be given in solution form and would also provide coverage for skin infection. -will continue IVF's, but will adjust rate -continue supportive care -Follow final cultures/sensitivity results. -family in agreement to treat the treatable and to go home home with hospice care. -VSS.  2-dementia -continue supportive care -no behavioral disturbances   3-sigmoid-rectal stool burden -Excellent response to smog enema -continue dulcolax and MiraLAX -Continue adequate hydration  4-dysphagia -continue dysphagia 1 diet and honey thick liquids  -difficulty swallowing Associated with patient's underlying dementia and high risk for aspiration with any other type of diet consistency.  5-severe protein calorie malnutrition  -feeding supplements as recommended by dietitian  -Body mass index is 13.21 kg/m.  -Patient on dysphagia 1 with honey thick liquids and overall no capable of eating too  much.  6-pressure injury: stage 2 on left shoulder and unstageable on her sacrum area -Appreciate general surgery recommendation -No surgical debridement anticipated at this time -Continue preventive measure, wound care and constant repositioning. -hospice to provide hospital bed and floating overaly mattress.   7-hypokalemia -repleted and within normal range now.  Code Status: DNR Family Communication: Son and daughter at bedside Disposition Plan: transition abx's to PO; change meds to tablets or solution to facilitate patient oral ingestion. waiti for hospice to deliver equipment. Anticipated Discharge on 10/07/17.  Consultants:  Palliative care  General surgery  Procedures:  See below for x-ray reports.  Antibiotics:  Rocephin  10/03/17>>> 10/06/17  HPI/Subjective: Afebrile, no CP, no nausea, no vomiting, in no distress.  Objective: Vitals:   10/06/17 0554 10/06/17 1801  BP: 118/67 (!) 152/82  Pulse: (!) 55 (!) 50  Resp: 16   Temp:  97.8 F (36.6 C)  SpO2: 93% 95%    Intake/Output Summary (Last 24 hours) at 10/06/2017 1852 Last data filed at 10/06/2017 0641 Gross per 24 hour  Intake 900 ml  Output 650 ml  Net 250 ml   Filed Weights   10/03/17 2102 10/04/17 0500 10/05/17 0500  Weight: 33.9 kg (74 lb 11.8 oz) 33.9 kg (74 lb 11.8 oz) 36 kg (79 lb 5.9 oz)    Exam:   General: chronically ill and underweight in appearance. No CP, no nausea, no vomiting.   Cardiovascular: S1 and S2, no rubs, no gallops; positive SEM.   Respiratory: CTA bilaterally.   Abdomen:soft, NT, ND, positive BS.  Musculoskeletal: no edema, no cyanosis; patient  With chronic atrophy and contracture.  Skin: unchanged exam on her skin  examination from 10/05/17; see below for details: Stage II pressure injury on her left shoulder (no drainage appreciated), unstageable decubitus ulcer with yellowish drainage and some discomfort for appreciated by body gestures when palpating on the wound.  No  odor.  Data Reviewed: Basic Metabolic Panel: Recent Labs  Lab 10/03/17 1336 10/04/17 0503 10/05/17 0540  NA 137 137 138  K 3.9 3.8 3.2*  CL 100* 105 107  CO2 30 27 24   GLUCOSE 106* 107* 118*  BUN 10 7 8   CREATININE 0.46 0.35* 0.41*  CALCIUM 9.3 8.6* 8.2*   Liver Function Tests: Recent Labs  Lab 10/03/17 1336  AST 21  ALT 14  ALKPHOS 132*  BILITOT 0.5  PROT 7.1  ALBUMIN 3.0*   CBC: Recent Labs  Lab 10/03/17 1336 10/04/17 0503 10/05/17 0540  WBC 3.4* 4.5 6.9  NEUTROABS 2.2  --   --   HGB 12.3 11.1* 10.7*  HCT 39.2 35.0* 35.5*  MCV 90.1 90.0 89.9  PLT 325 300 265   Cardiac Enzymes: Recent Labs  Lab 10/03/17 1336  TROPONINI <0.03    Recent Results (from the past 240 hour(s))  Wound or Superficial Culture     Status: None   Collection Time: 10/03/17 12:51 PM  Result Value Ref Range Status   Specimen Description   Final    SACRAL Performed at [facility], [address] [place], [place] [address]    Special Requests   Final    Normal Performed at [facility], [address] [place], [place] [address]    Gram Stain   Final    RARE WBC PRESENT, PREDOMINANTLY PMN ABUNDANT GRAM POSITIVE RODS ABUNDANT GRAM POSITIVE COCCI MODERATE GRAM NEGATIVE RODS    Culture   Final    NORMAL SKIN FLORA Performed at [facility], [place] [address], [place], [place] [address]    Report Status 10/06/2017 FINAL  Final  Urine culture     Status: Abnormal   Collection Time: 10/03/17 12:54 PM  Result Value Ref Range Status   Specimen Description   Final    URINE, RANDOM Performed at [facility], [address], [place], [place] [address]    Special Requests   Final    URINE, RANDOM Performed at [facility], [address], [place], [place] [address]    Culture >=100,000 COLONIES/mL ESCHERICHIA COLI (A)  Final   Report Status 10/06/2017 FINAL  Final   Organism ID, Bacteria ESCHERICHIA COLI (A)  Final      Susceptibility   Escherichia coli - MIC*     AMPICILLIN <=2 SENSITIVE Sensitive     CEFAZOLIN <=4 SENSITIVE Sensitive     CEFTRIAXONE <=1 SENSITIVE Sensitive     CIPROFLOXACIN <=0.25 SENSITIVE Sensitive     GENTAMICIN 4 SENSITIVE Sensitive     IMIPENEM <=0.25 SENSITIVE Sensitive     NITROFURANTOIN <=16 SENSITIVE Sensitive     TRIMETH/SULFA <=20 SENSITIVE Sensitive     AMPICILLIN/SULBACTAM <=2 SENSITIVE Sensitive     PIP/TAZO <=4 SENSITIVE Sensitive     Extended ESBL NEGATIVE Sensitive     * >=100,000 COLONIES/mL ESCHERICHIA COLI  Culture, blood (Routine X 2) w Reflex to ID Panel     Status: None (Preliminary result)   Collection Time: 10/03/17  6:50 PM  Result Value Ref Range Status   Specimen Description BLOOD LEFT ARM  Final   Special Requests   Final    BOTTLES DRAWN AEROBIC ONLY Blood Culture adequate volume   Culture  Final    NO GROWTH 3 DAYS Performed at [facility], [address], [place], [place] [address]    Report Status PENDING  Incomplete  Culture, blood (Routine X 2) w Reflex to ID Panel     Status: None (Preliminary result)   Collection Time: 10/03/17  7:03 PM  Result Value Ref Range Status   Specimen Description BLOOD LEFT HAND  Final   Special Requests   Final    BOTTLES DRAWN AEROBIC ONLY Blood Culture adequate volume   Culture   Final    NO GROWTH 3 DAYS Performed at [facility], [address], [place], [place] [address]    Report Status PENDING  Incomplete  MRSA PCR Screening     Status: None   Collection Time: 10/03/17  8:40 PM  Result Value Ref Range Status   MRSA by PCR NEGATIVE NEGATIVE Final    Comment:        The GeneXpert MRSA Assay (FDA approved for NASAL specimens only), is one component of a comprehensive MRSA colonization surveillance program. It is not intended to diagnose MRSA infection nor to guide or monitor treatment for MRSA infections. Performed at [facility], [address], [place], [place] [address]      Studies: No results found.  Scheduled Meds: . bisacodyl   5 mg Oral Daily  . feeding supplement (ENSURE ENLIVE)  237 mL Oral BID BM  . polyethylene glycol  17 g Oral Daily   Continuous Infusions: . sodium chloride 75 mL/hr at 10/06/17 1613  . cefTRIAXone (ROCEPHIN)  IV Stopped (10/06/17 1658)    Time spent: 30 minutes    [facility] Pager [phone]. If 7PM-7AM, please contact night-coverage at [email or web address], password [facility] 10/06/2017, 6:52 PM  LOS: 2 days

## 2017-10-06 NOTE — Care Management (Addendum)
Spoke with Diana Byrd of [facility] Patient can be enrolled tomorrow. Diana Byrd will call Diana Byrd tonight and discuss enrollment and equipment needed and order with [employer].  After equipment has been delivered to patients home tomorrow, Suzi Roots will alert bedside RN and EMS transport can be arranged to take patient home.  Discussed all this with Diana Byrd, who is agreeable to plan.

## 2017-10-07 DIAGNOSIS — E860 Dehydration: Secondary | ICD-10-CM

## 2017-10-07 DIAGNOSIS — N390 Urinary tract infection, site not specified: Secondary | ICD-10-CM

## 2017-10-07 DIAGNOSIS — A4151 Sepsis due to Escherichia coli [E. coli]: Secondary | ICD-10-CM

## 2017-10-07 MED ORDER — BISACODYL 5 MG PO TBEC: 5.0000 mg | DELAYED_RELEASE_TABLET | Freq: Every day | ORAL | 1 refills | Status: AC

## 2017-10-07 MED ORDER — POLYETHYLENE GLYCOL 3350 17 G PO PACK: 17.0000 g | PACK | Freq: Every day | ORAL | 1 refills | Status: AC

## 2017-10-07 MED ORDER — AMOXICILLIN-POT CLAVULANATE 250-62.5 MG/5ML PO SUSR: 250.0000 mg | Freq: Two times a day (BID) | ORAL | 0 refills | Status: AC

## 2017-10-07 MED ORDER — RESOURCE THICKENUP CLEAR PO POWD: ORAL | 1 refills | Status: AC

## 2017-10-07 MED ORDER — ENSURE ENLIVE PO LIQD: 237.0000 mL | Freq: Two times a day (BID) | ORAL | Status: AC

## 2017-10-07 NOTE — Discharge Summary (Signed)
Physician Discharge Summary  Diana Byrd:[id number] DOB: April 02, 1924 DOA: 10/03/2017  PCP: Redmond School, MD  Admit date: 10/03/2017 Discharge date: 10/07/2017  Time spent: 35 minutes  Recommendations for Outpatient Follow-up:  1. Repeat BMET to follow electrolytes trend. 2. Home hospice and DNR status.   Discharge Diagnoses:  Principal Problem:   Hypothermia Active Problems:   Dementia   Acute lower UTI   SIRS (systemic inflammatory response syndrome) (HCC)   Pressure injury of skin   Protein-calorie malnutrition, severe   Encounter for hospice care discussion   Goals of care, counseling/discussion   Palliative care by specialist   Dehydration   Urinary tract infection without hematuria   Sepsis due to Escherichia coli (E. coli) ([place])   Discharge Condition: stable and improved. Discharge home with instructions to follow up with PCP in 2 weeks and to follow instructions/recommendations by home hospice staff.   Diet recommendation: dysphagia 1 with honey thick liquids  Filed Weights   10/03/17 2102 10/04/17 0500 10/05/17 0500  Weight: 33.9 kg (74 lb 11.8 oz) 33.9 kg (74 lb 11.8 oz) 36 kg (79 lb 5.9 oz)    History of present illness:  84 y.o.femalewith medical history significant ofadvanced dementia who is nonverbal in bedbound went to see her primary care physician today because of vomiting and developing a bedsore. Family takes care of her at home. She has not had any fevers. No diarrhea. She did vomit once and that was all. She is not exhibiting any signs of pain. Family denies any fevers. Patient was brought to the emergency department from primary care physician office for low blood pressure. She is found to have a UTI with a temperature of 92 and was referred for admission for urosepsis.  All history was obtained from daughter at bedside as the patient is nonverbal.    Hospital Course:  1-sepsis due to E. coli UTI: Also with concern of potential ongoing  infection in her decubitus ulcer. -sepsis features essentially resolved -will switch antibiotics to PO; will use augmentin; as can be given in solution form and would also provide coverage for underlying skin infection. -will continue IVF's, but will adjust rate -continue supportive care -Follow final cultures/sensitivity results. -family in agreement to treat the treatable and to go home home with hospice care. -VSS.  2-dementia -continue supportive care -no behavioral disturbances   3-sigmoid-rectal stool burden -Excellent response to smog enema -continue dulcolax and MiraLAX -Continue adequate hydration  4-dysphagia -continue dysphagia 1 diet and honey thick liquids  -difficulty swallowing Associated with patient's underlying dementia and high risk for aspiration with any other type of diet consistency.  5-severe protein calorie malnutrition  -feeding supplements as recommended by dietitian  -Body mass index is 13.21 kg/m.  -Patient on dysphagia 1 with honey thick liquids and overall no capable of eating too much.  6-pressure injury: stage 2 on left shoulder and unstageable on her sacrum area -Appreciate general surgery recommendation -No surgical debridement anticipated at this time -Continue preventive measure, wound care and constant repositioning. -hospice to provide hospital bed and floating overaly mattress.   7-hypokalemia -repleted and within normal range now.  Procedures:  See below for x-ray reports   Consultations:  Palliative care  Hospice   Discharge Exam: Vitals:   10/06/17 2127 10/07/17 0508  BP:  (!) 153/84  Pulse:  66  Resp:  16  Temp:  98 F (36.7 C)  SpO2: 98% 97%    General: chronically ill and underweight in appearance. No CP,  no nausea, no vomiting.   Cardiovascular: S1 and S2, no rubs, no gallops; positive SEM.   Respiratory: CTA bilaterally.   Abdomen:soft, NT, ND, positive BS.  Musculoskeletal: no edema, no cyanosis;  patient  With chronic atrophy and contracture.  Skin: unchanged exam on her skin examination from 10/06/17; see below for details: Stage II pressure injury on her left shoulder (no drainage appreciated), unstageable decubitus ulcer with yellowish drainage and some discomfort for appreciated by body gestures when palpating on the wound.  No odor.   Discharge Instructions   Discharge Instructions    Discharge instructions   Complete by:  As directed    Follow-up by home hospice at discharge Follow-up with PCP in 14 days Complete medications as instructed and maintain adequate hydration. Follow dysphagia 1 diet with honey thick liquids.     Allergies as of 10/07/2017      Reactions   Penicillins Rash   Has patient had a PCN reaction causing immediate rash, facial/tongue/throat swelling, SOB or lightheadedness with hypotension: Unknown Has patient had a PCN reaction causing severe rash involving mucus membranes or skin necrosis: Unknown Has patient had a PCN reaction that required hospitalization: Unknown Has patient had a PCN reaction occurring within the last 10 years: Unknown If all of the above answers are "NO", then may proceed with Cephalosporin use.      Medication List    TAKE these medications   amoxicillin-clavulanate 250-62.5 MG/5ML suspension Commonly known as:  AUGMENTIN Take 5 mLs (250 mg total) by mouth every 12 (twelve) hours for 6 days.   bisacodyl 5 MG EC tablet Commonly known as:  DULCOLAX Take 1 tablet (5 mg total) by mouth daily. Start taking on:  10/08/2017   CRANBERRY EXTRACT PO Take 1 capsule by mouth daily. Opened and mixed in applesauce for administration   feeding supplement (ENSURE ENLIVE) Liqd Take 237 mLs by mouth 2 (two) times daily between meals.   nystatin-triamcinolone cream Commonly known as:  MYCOLOG II Apply 1 application topically daily as needed (for irritation).   polyethylene glycol packet Commonly known as:  MIRALAX / GLYCOLAX Take  17 g by mouth daily.   RESOURCE THICKENUP CLEAR Powd Use as needed to make all liquids into honey thick consistency      Allergies  Allergen Reactions  . Penicillins Rash    Has patient had a PCN reaction causing immediate rash, facial/tongue/throat swelling, SOB or lightheadedness with hypotension: Unknown Has patient had a PCN reaction causing severe rash involving mucus membranes or skin necrosis: Unknown Has patient had a PCN reaction that required hospitalization: Unknown Has patient had a PCN reaction occurring within the last 10 years: Unknown If all of the above answers are "NO", then may proceed with Cephalosporin use.    Follow-up Information    Redmond School, MD. Schedule an appointment as soon as possible for a visit in 2 week(s).   Specialty:  Internal Medicine Contact information: [address] [place] [facility] [place] [address] [phone]           The results of significant diagnostics from this hospitalization (including imaging, microbiology, ancillary and laboratory) are listed below for reference.    Significant Diagnostic Studies: Ct Abdomen Pelvis W Contrast  Result Date: 10/03/2017 CLINICAL DATA:  84 year old female. Question abnormal chest x-ray/abdominal films. Dementia. Post hysterectomy. Bed sores. Subsequent encounter. EXAM: CT ABDOMEN AND PELVIS WITH CONTRAST TECHNIQUE: Multidetector CT imaging of the abdomen and pelvis was performed using the standard protocol following bolus administration of intravenous contrast.  CONTRAST:  59mL ISOVUE-300 IOPAMIDOL (ISOVUE-300) INJECTION 61% COMPARISON:  Abdominal film chest x-ray 10/03/2017. 09/27/2012 and 06/21/2012 CT abdomen and pelvis. FINDINGS: Lower chest: Basilar subsegmental atelectasis/scarring. Minimal nodular density right middle lobe stable. Mild cardiomegaly. Coronary artery calcifications. Mild prominence pulmonary artery. Hepatobiliary: No worrisome hepatic lesion. No calcified gallstones. Pancreas: No  pancreatic mass or inflammation. Spleen: No splenic mass or enlargement. Adrenals/Urinary Tract: No obstructing stone. Extra renal pelvis without significant change. Tiny renal cysts without worrisome renal or adrenal mass. Stomach/Bowel: Marked amount of stool rectosigmoid region placing the patient at risk for stercoral colitis. Gas-filled ascending colon and proximal transverse colon. Lack of fat planes and under distension limited evaluation of small bowel and stomach. No obvious small bowel or gastric abnormality detected. Vascular/Lymphatic: Atherosclerotic changes abdominal aorta with ectasia. Maximal transverse dimension 2.7 x 2.4 cm. Atherosclerotic changes aortic branch vessels without large vessel occlusion. Attenuated inferior mesenteric artery. No adenopathy. Reproductive: Prior hysterectomy.  No worrisome adnexal mass. Other: No free intraperitoneal air or bowel containing hernia. Musculoskeletal: Sacral decubitus extends to the lower sacrum/coccyx. Scoliosis thoracic and lumbar spine. IMPRESSION: No free intraperitoneal air. Gas filled prominent size ascending colon and proximal transverse colon. Prominent stool rectosigmoid region placing the patient at risk for stercoral colitis. Limited evaluation of stomach and small bowel secondary to under distension and lack of fat planes. No obvious gastric/small bowel abnormality. Sacral decubitus extends to the lower sacrum and coccyx. Aortic Atherosclerosis (ICD10-I70.0). Abdominal aortic ectasia. Atherosclerotic changes aortic branch vessels with attenuated inferior mesenteric artery. Coronary artery calcifications. Electronically Signed   By: Genia Del M.D.   On: 10/03/2017 18:04   Dg Chest Port 1 View  Result Date: 10/03/2017 CLINICAL DATA:  Weakness.  History of dementia. EXAM: PORTABLE CHEST 1 VIEW COMPARISON:  09/27/2012.  04/22/2012. FINDINGS: Prior median sternotomy. Cardiomegaly with tortuosity of the great vessels again noted without  interim change. No focal infiltrate. Low lung volumes with mild bibasilar subsegmental atelectasis. No prominent pleural effusion. No pneumothorax. Distended loops of bowel. Free air under the hemidiaphragm may be present. Abdominal series suggested for further evaluation. No acute bony abnormality. IMPRESSION: 1. Distended loops of bowel with possible free intraperitoneal air. Abdominal series suggested for further evaluation. 2. Prior CABG. Stable cardiomegaly and tortuous great vessels. Mild left base subsegmental atelectasis. Critical Value/emergent results were called by telephone at the time of interpretation on 10/03/2017 at 1:25 pm to Dr. Francine Graven , who verbally acknowledged these results. Electronically Signed   By: Marcello Moores  Register   On: 10/03/2017 13:28   Dg Abd 2 Views  Result Date: 10/03/2017 CLINICAL DATA:  Abnormal chest x-ray. EXAM: ABDOMEN - 2 VIEW COMPARISON:  Chest x-ray 10/03/2017 FINDINGS: There is no bowel dilatation to suggest obstruction. There is large relative lucency overlying the liver. On the decubitus view there is no free intraperitoneal air identified. There are no pathologic calcifications along the expected course of the ureters. The osseous structures are unremarkable. IMPRESSION: On left lateral decubitus view there is no definite free intraperitoneal air identified but there is a large relative lucency overlying the liver on the supine view and also on the chest x-ray performed earlier same day which can be seen with free air secondary to perforated viscus. Given the discordant findings on the decubitus versus supine view, recommend further evaluation with a CT of the abdomen/pelvis. Electronically Signed   By: Kathreen Devoid   On: 10/03/2017 15:38    Microbiology: Recent Results (from the past 240 hour(s))  Wound or  Superficial Culture     Status: None   Collection Time: 10/03/17 12:51 PM  Result Value Ref Range Status   Specimen Description   Final     SACRAL Performed at [facility], [address], [place], [place] [address]    Special Requests   Final    Normal Performed at [facility], [address], [place], [place] [address]    Gram Stain   Final    RARE WBC PRESENT, PREDOMINANTLY PMN ABUNDANT GRAM POSITIVE RODS ABUNDANT GRAM POSITIVE COCCI MODERATE GRAM NEGATIVE RODS    Culture   Final    NORMAL SKIN FLORA Performed at [facility], [place] [address], [place], [place] [address]    Report Status 10/06/2017 FINAL  Final  Urine culture     Status: Abnormal   Collection Time: 10/03/17 12:54 PM  Result Value Ref Range Status   Specimen Description   Final    URINE, RANDOM Performed at [facility], [address] [place], [place] [address]    Special Requests   Final    URINE, RANDOM Performed at [facility], [address] [place], [place] [address]    Culture >=100,000 COLONIES/mL ESCHERICHIA COLI (A)  Final   Report Status 10/06/2017 FINAL  Final   Organism ID, Bacteria ESCHERICHIA COLI (A)  Final      Susceptibility   Escherichia coli - MIC*    AMPICILLIN <=2 SENSITIVE Sensitive     CEFAZOLIN <=4 SENSITIVE Sensitive     CEFTRIAXONE <=1 SENSITIVE Sensitive     CIPROFLOXACIN <=0.25 SENSITIVE Sensitive     GENTAMICIN 4 SENSITIVE Sensitive     IMIPENEM <=0.25 SENSITIVE Sensitive     NITROFURANTOIN <=16 SENSITIVE Sensitive     TRIMETH/SULFA <=20 SENSITIVE Sensitive     AMPICILLIN/SULBACTAM <=2 SENSITIVE Sensitive     PIP/TAZO <=4 SENSITIVE Sensitive     Extended ESBL NEGATIVE Sensitive     * >=100,000 COLONIES/mL ESCHERICHIA COLI  Culture, blood (Routine X 2) w Reflex to ID Panel     Status: None (Preliminary result)   Collection Time: 10/03/17  6:50 PM  Result Value Ref Range Status   Specimen Description BLOOD LEFT ARM  Final   Special Requests   Final    BOTTLES DRAWN AEROBIC ONLY Blood Culture adequate volume   Culture   Final    NO GROWTH 4 DAYS Performed at [facility], [address]  [place],  [place] [address]    Report Status PENDING  Incomplete  Culture, blood (Routine X 2) w Reflex to ID Panel     Status: None (Preliminary result)   Collection Time: 10/03/17  7:03 PM  Result Value Ref Range Status   Specimen Description BLOOD LEFT HAND  Final   Special Requests   Final    BOTTLES DRAWN AEROBIC ONLY Blood Culture adequate volume   Culture   Final    NO GROWTH 4 DAYS Performed at [facility], [address] [place], [place] [address]    Report Status PENDING  Incomplete  MRSA PCR Screening     Status: None   Collection Time: 10/03/17  8:40 PM  Result Value Ref Range Status   MRSA by PCR NEGATIVE NEGATIVE Final    Comment:        The GeneXpert MRSA Assay (FDA approved for NASAL specimens only), is one component of a comprehensive MRSA colonization surveillance program. It is not intended to diagnose MRSA infection nor to guide or monitor treatment for MRSA  infections. Performed at [facility], [address] [place], [place] [address]      Labs: Basic Metabolic Panel: Recent Labs  Lab 10/03/17 1336 10/04/17 0503 10/05/17 0540  NA 137 137 138  K 3.9 3.8 3.2*  CL 100* 105 107  CO2 30 27 24   GLUCOSE 106* 107* 118*  BUN 10 7 8   CREATININE 0.46 0.35* 0.41*  CALCIUM 9.3 8.6* 8.2*   Liver Function Tests: Recent Labs  Lab 10/03/17 1336  AST 21  ALT 14  ALKPHOS 132*  BILITOT 0.5  PROT 7.1  ALBUMIN 3.0*   CBC: Recent Labs  Lab 10/03/17 1336 10/04/17 0503 10/05/17 0540  WBC 3.4* 4.5 6.9  NEUTROABS 2.2  --   --   HGB 12.3 11.1* 10.7*  HCT 39.2 35.0* 35.5*  MCV 90.1 90.0 89.9  PLT 325 300 265   Cardiac Enzymes: Recent Labs  Lab 10/03/17 1336  TROPONINI <0.03    Signed:  Barton Dubois MD.  [facility] 10/07/2017, 10:58 AM

## 2017-10-08 LAB — CULTURE, BLOOD (ROUTINE X 2)
CULTURE: NO GROWTH
Culture: NO GROWTH
SPECIAL REQUESTS: ADEQUATE
SPECIAL REQUESTS: ADEQUATE

## 2017-12-07 DEATH — deceased

## 2018-11-21 IMAGING — DX DG ABDOMEN 2V
2 series · 2 of 2 positions shown · non-contrast
Comparison: Chest x-ray 10/03/2017

CLINICAL DATA: Abnormal chest x-ray.

EXAM:
ABDOMEN - 2 VIEW

[abdomen erect]
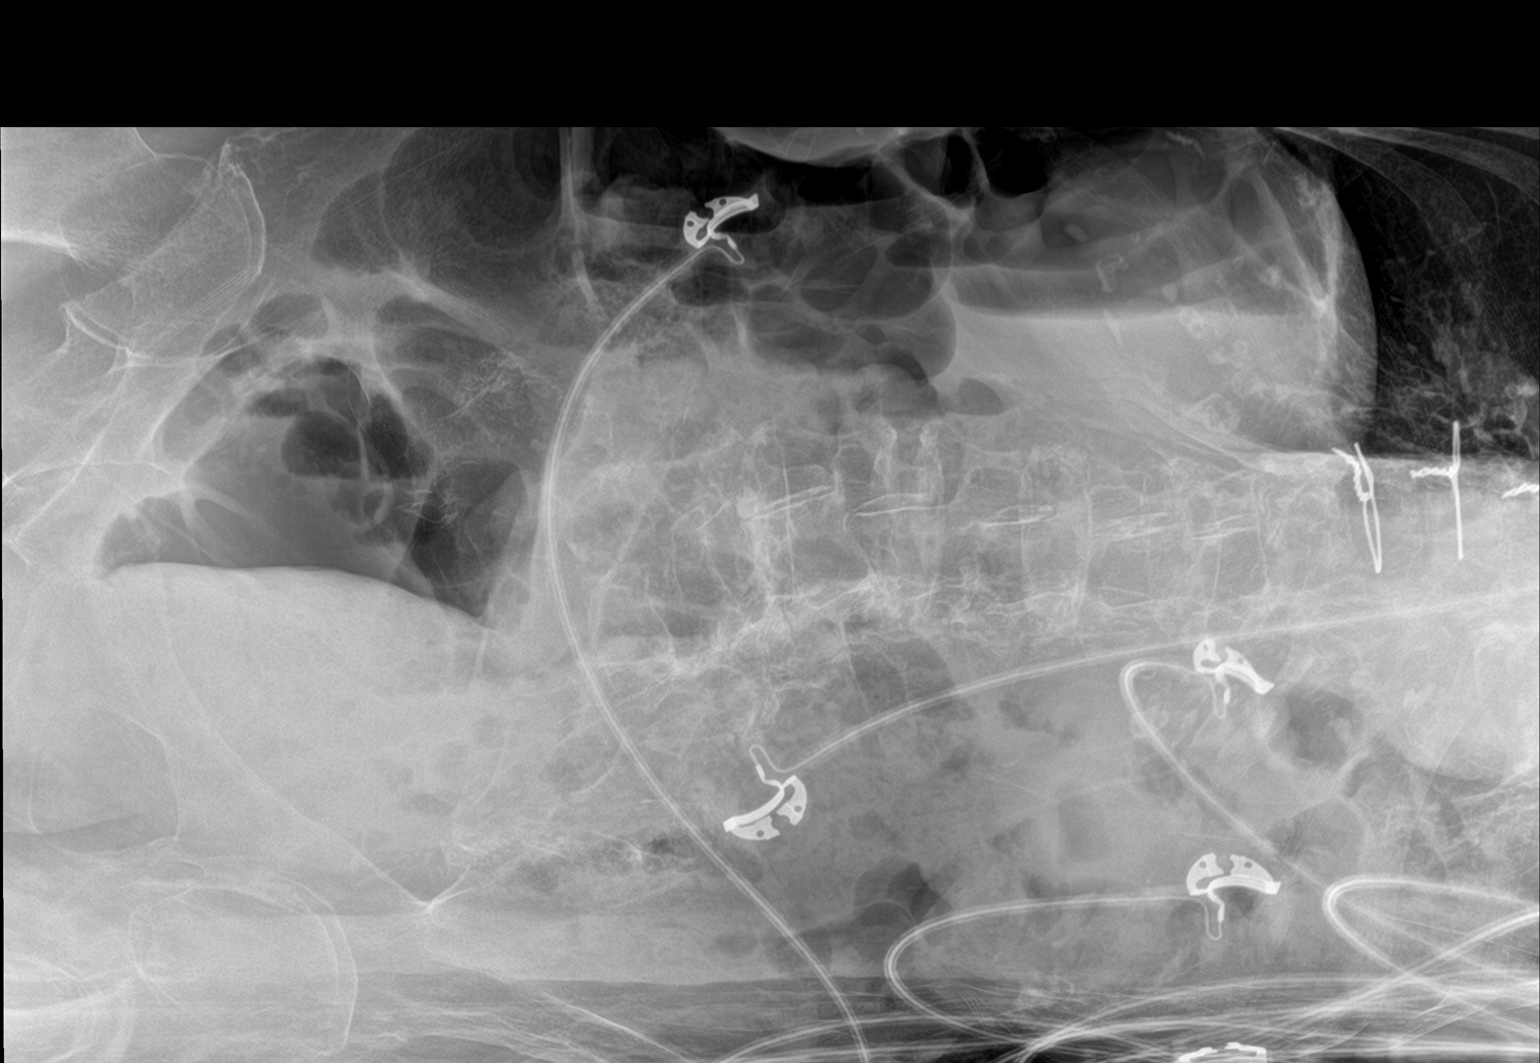

[abdomen supine]
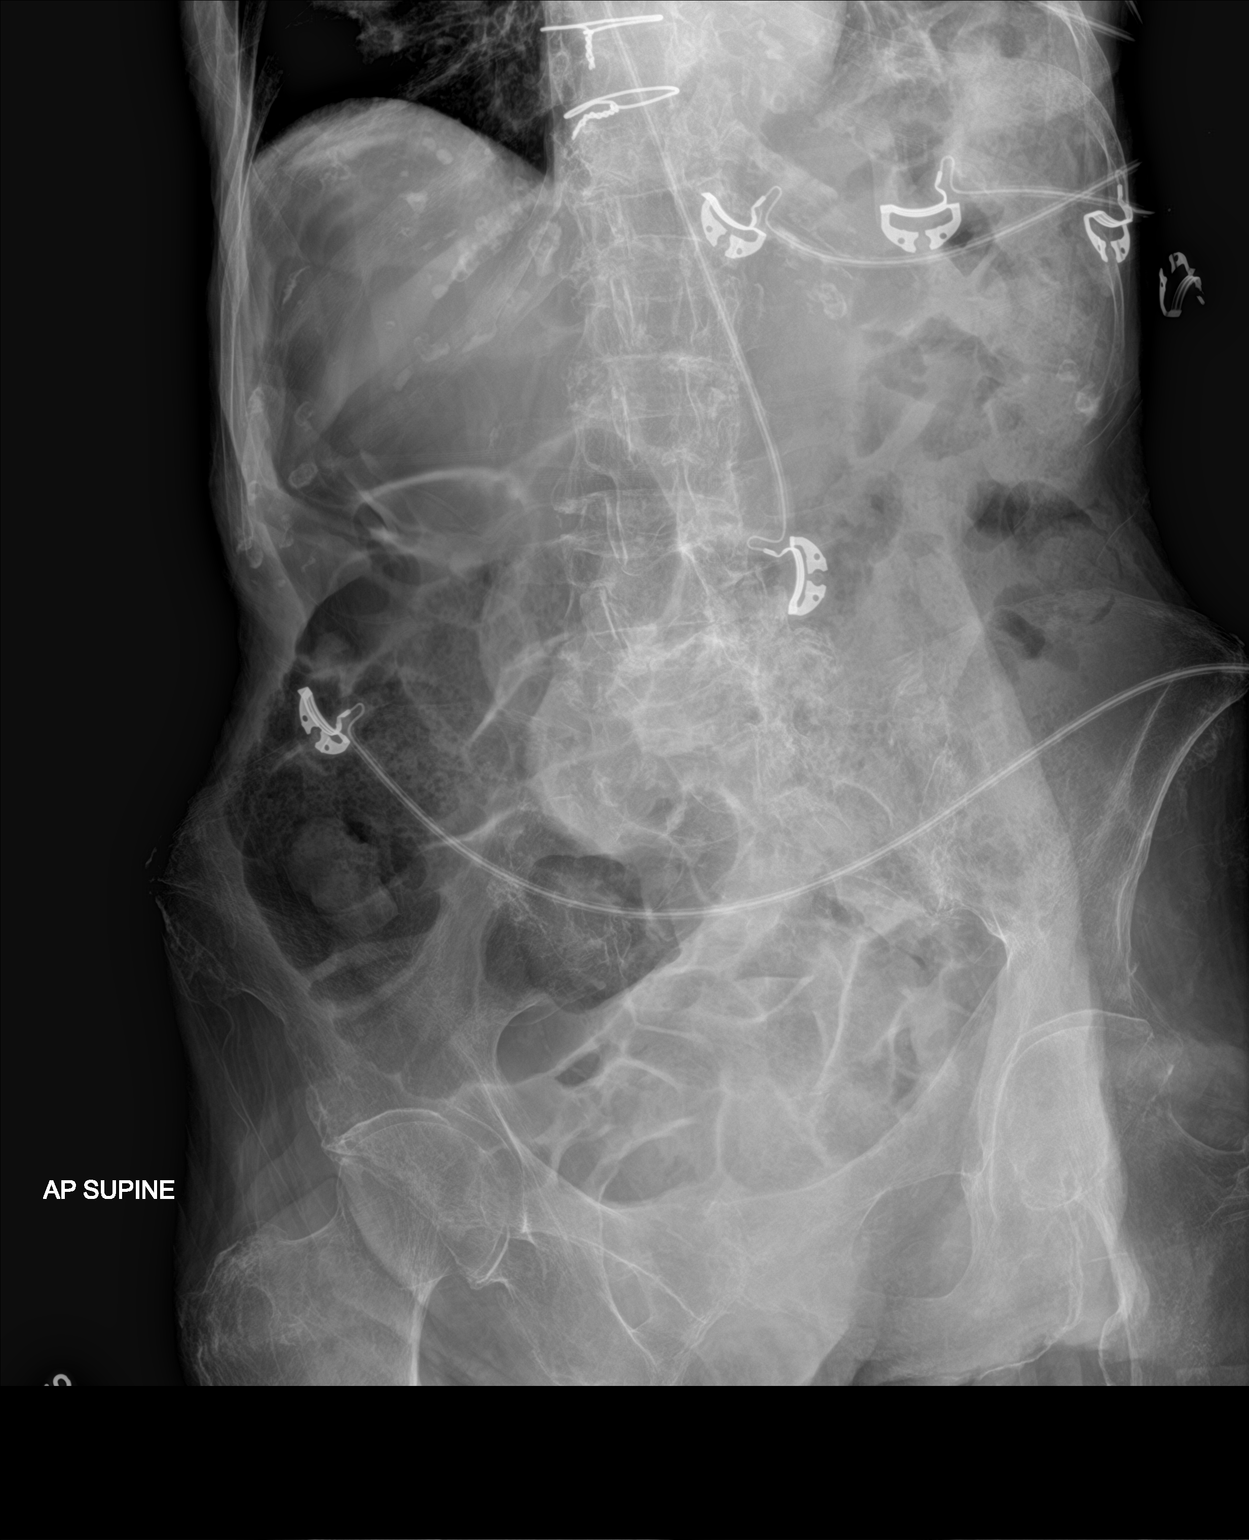

[2 of 2 positions shown; findings below may reference images not displayed]

FINDINGS: There is no bowel dilatation to suggest obstruction. There is large
relative lucency overlying the liver. On the decubitus view there is
no free intraperitoneal air identified.

There are no pathologic calcifications along the expected course of
the ureters.

The osseous structures are unremarkable.
IMPRESSION: On left lateral decubitus view there is no definite free
intraperitoneal air identified but there is a large relative lucency
overlying the liver on the supine view and also on the chest x-ray
performed earlier same day which can be seen with free air secondary
to perforated viscus. Given the discordant findings on the decubitus
versus supine view, recommend further evaluation with a CT of the
abdomen/pelvis.

## 2018-11-21 IMAGING — CT CT ABD-PELV W/ CM
2 of 5 series · 15 of 46 positions shown, 17 images · IV contrast (Isovue)
Comparison: Abdominal film chest x-ray 10/03/2017. 09/27/2012 and
06/21/2012 CT abdomen and pelvis.

CLINICAL DATA: [AGE] female. Question abnormal chest
x-ray/abdominal films. Dementia. Post hysterectomy. Bed sores.
Subsequent encounter.

EXAM:
CT ABDOMEN AND PELVIS WITH CONTRAST
TECHNIQUE: Multidetector CT imaging of the abdomen and pelvis was performed
using the standard protocol following bolus administration of
intravenous contrast.
CONTRAST:  80mL PMX7TA-TLL IOPAMIDOL (PMX7TA-TLL) INJECTION 61%

[Series 2: axial st · axial · 0.57mm/px · z∈[-480,-115]mm · 12 of 83 slices shown, 14 images]
[im 5/83  soft-tissue]
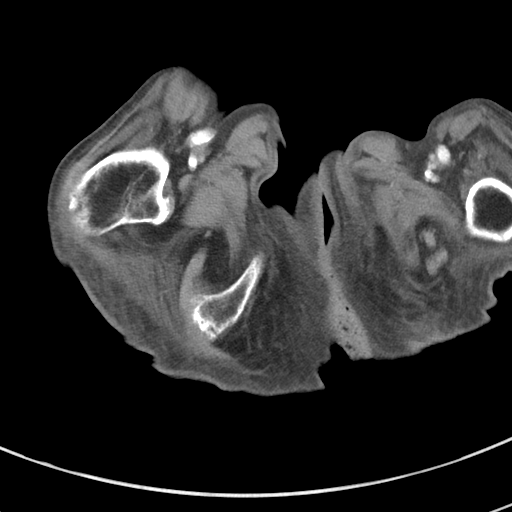
[im 5/83  bone]
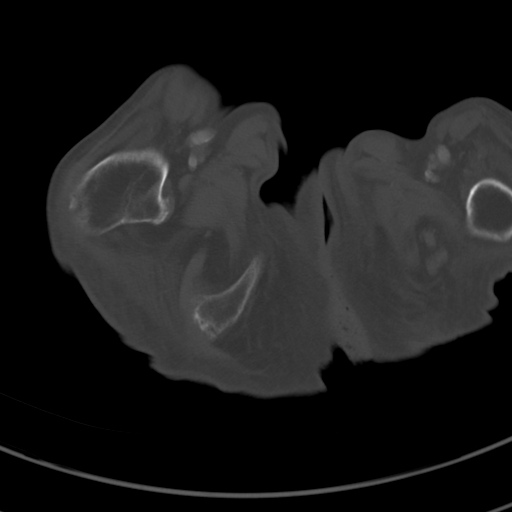
[im 14/83  soft-tissue]
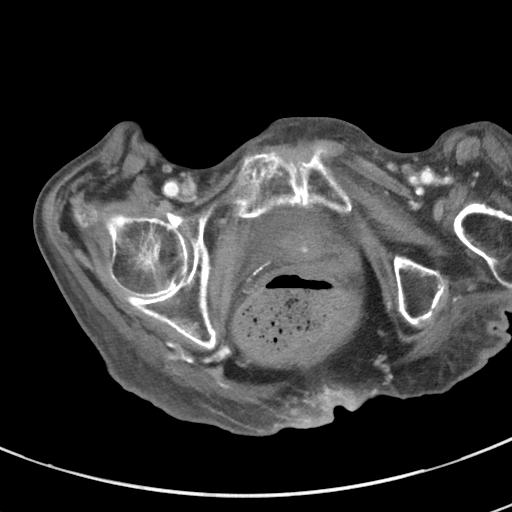
[im 19/83  soft-tissue]
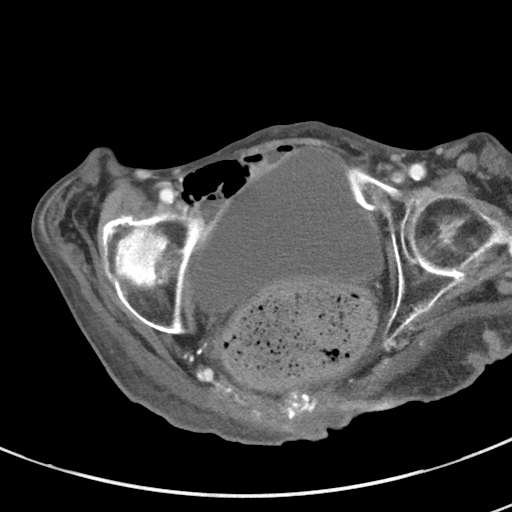
[im 23/83  soft-tissue]
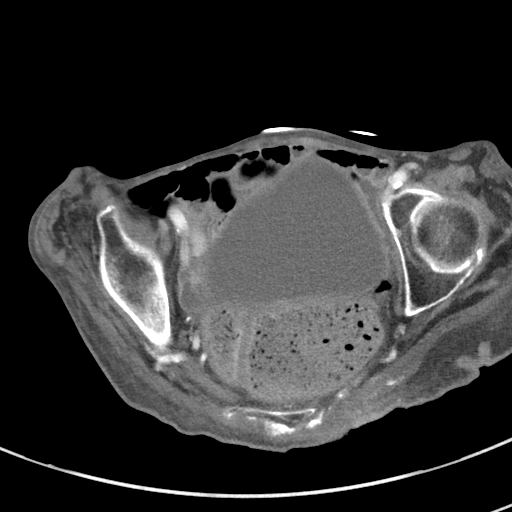
[im 32/83  soft-tissue]
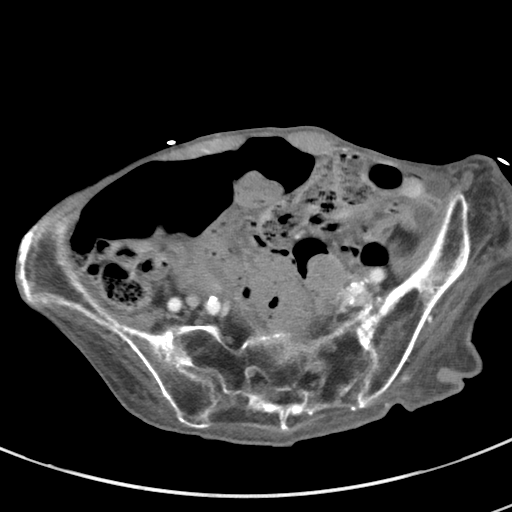
[im 37/83  soft-tissue]
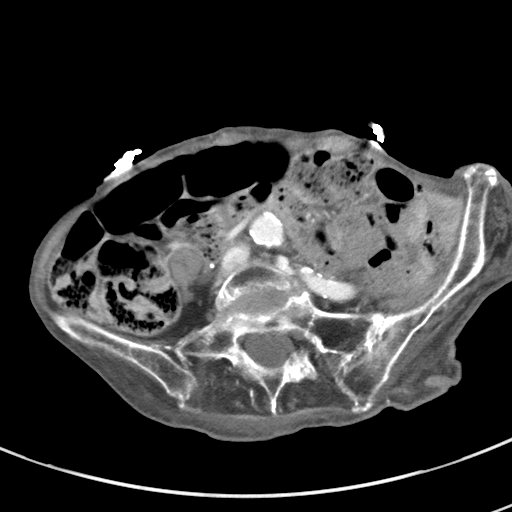
[im 46/83  soft-tissue]
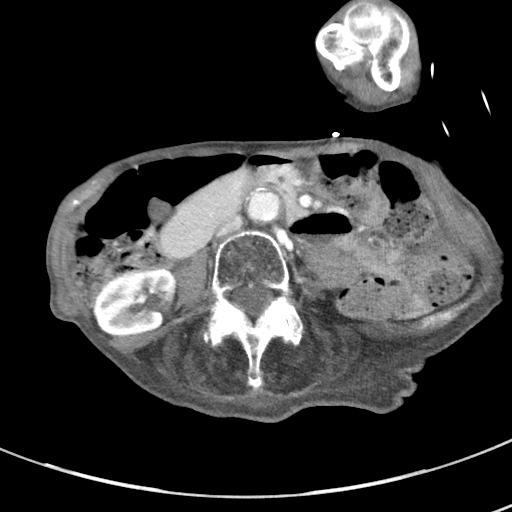
[im 51/83  soft-tissue]
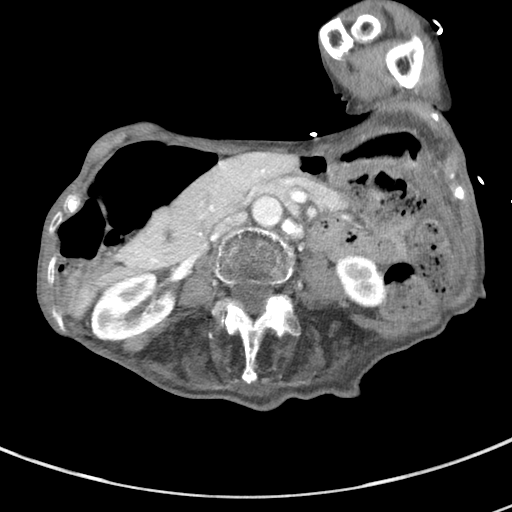
[im 60/83  soft-tissue]
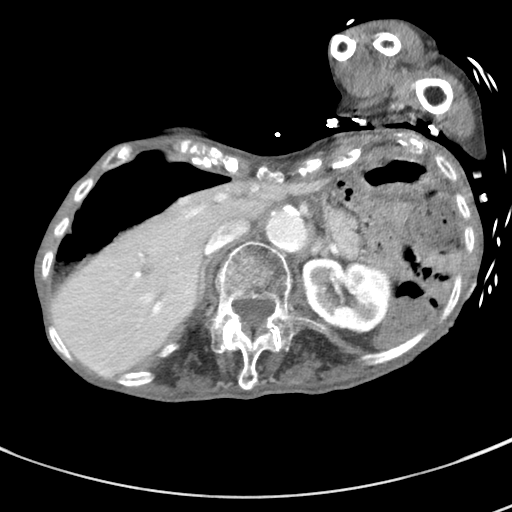
[im 60/83  bone]
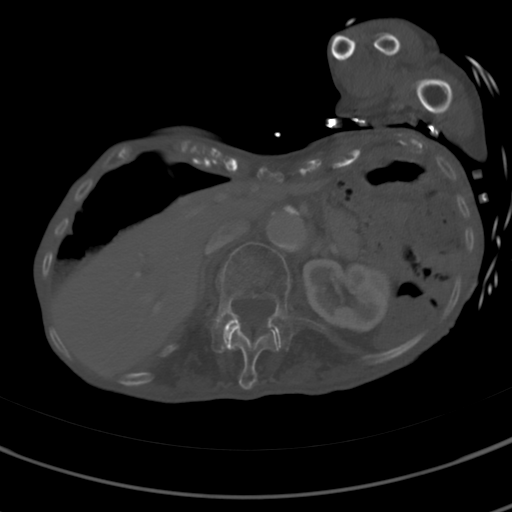
[im 64/83  soft-tissue]
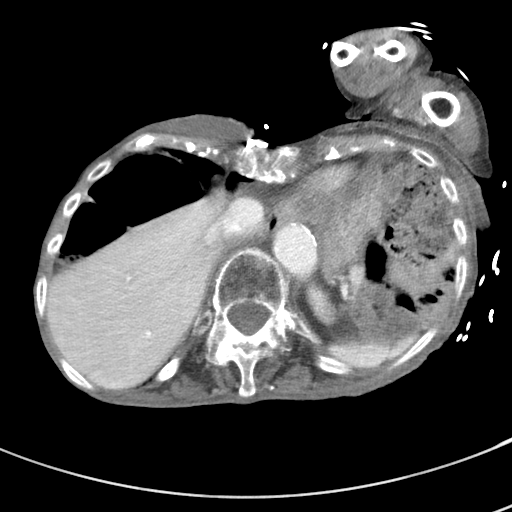
[im 69/83  soft-tissue]
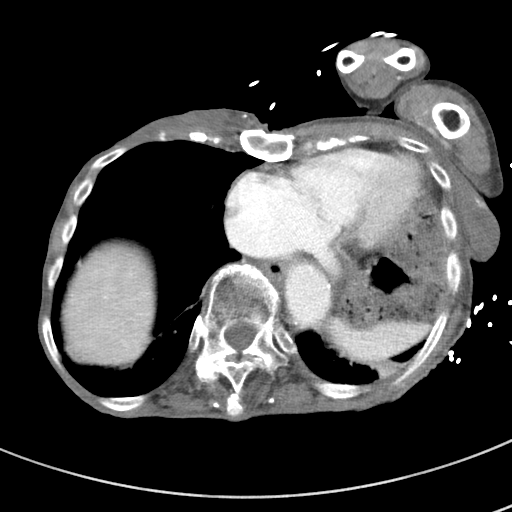
[im 78/83  soft-tissue]
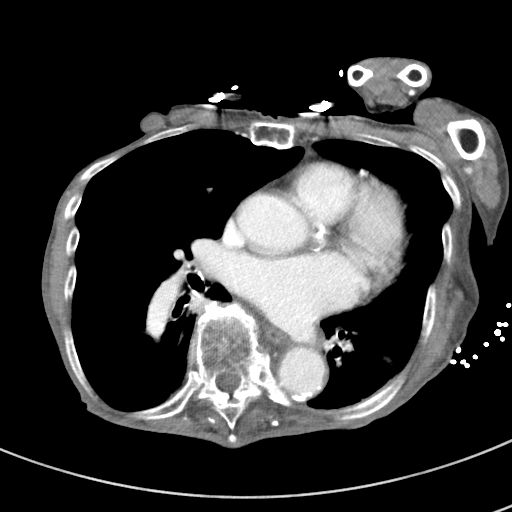

[Series 5: coronal st · coronal · 0.58mm/px · 3 of 82 slices shown]
[im 28/82  soft-tissue]
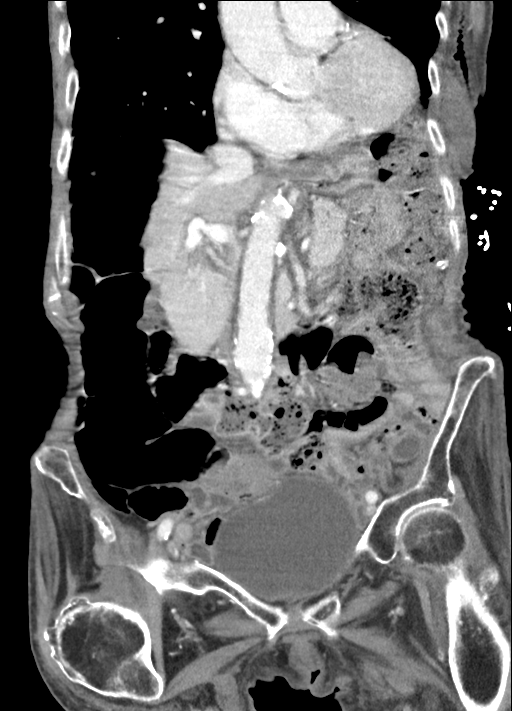
[im 37/82  soft-tissue]
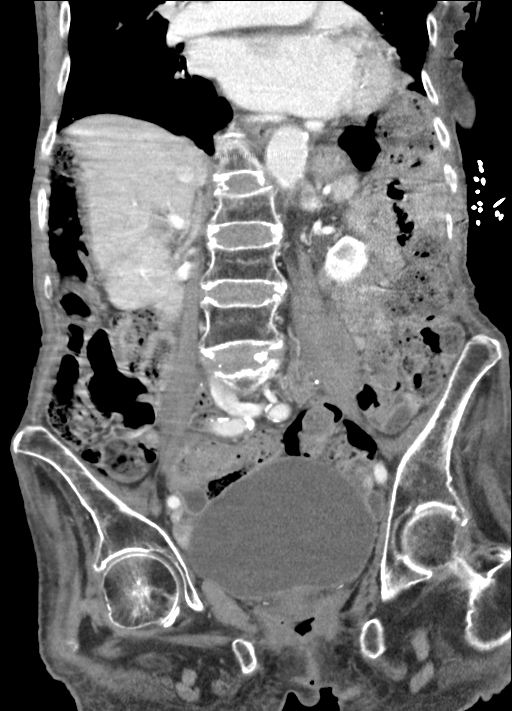
[im 46/82  soft-tissue]
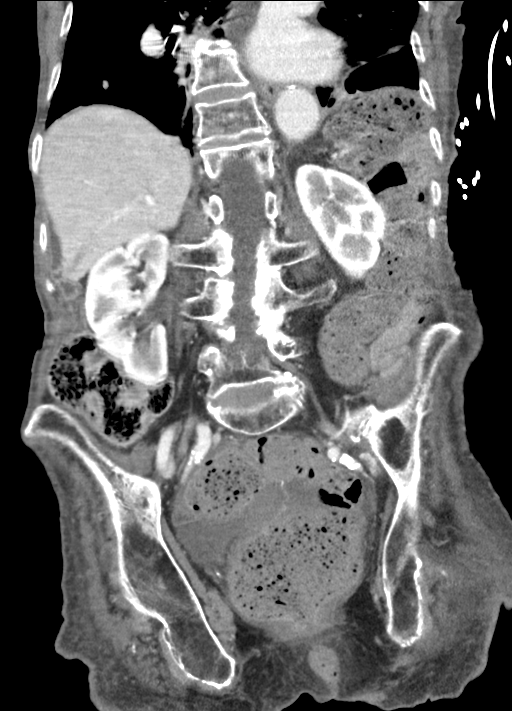

[15 of 46 positions shown; findings below may reference images not displayed]

FINDINGS: Lower chest: Basilar subsegmental atelectasis/scarring. Minimal
nodular density right middle lobe stable.

Mild cardiomegaly. Coronary artery calcifications. Mild prominence
pulmonary artery.

Hepatobiliary: No worrisome hepatic lesion. No calcified gallstones.

Pancreas: No pancreatic mass or inflammation.

Spleen: No splenic mass or enlargement.

Adrenals/Urinary Tract: No obstructing stone. Extra renal pelvis
without significant change. Tiny renal cysts without worrisome renal
or adrenal mass.

Stomach/Bowel: Marked amount of stool rectosigmoid region placing
the patient at risk for stercoral colitis. Gas-filled ascending
colon and proximal transverse colon.

Lack of fat planes and under distension limited evaluation of small
bowel and stomach. No obvious small bowel or gastric abnormality
detected.

Vascular/Lymphatic: Atherosclerotic changes abdominal aorta with
ectasia. Maximal transverse dimension 2.7 x 2.4 cm. Atherosclerotic
changes aortic branch vessels without large vessel occlusion.
Attenuated inferior mesenteric artery.

No adenopathy.

Reproductive: Prior hysterectomy.  No worrisome adnexal mass.

Other: No free intraperitoneal air or bowel containing hernia.

Musculoskeletal: Sacral decubitus extends to the lower
sacrum/coccyx.

Scoliosis thoracic and lumbar spine.
IMPRESSION: No free intraperitoneal air. Gas filled prominent size ascending
colon and proximal transverse colon.

Prominent stool rectosigmoid region placing the patient at risk for
stercoral colitis.

Limited evaluation of stomach and small bowel secondary to under
distension and lack of fat planes. No obvious gastric/small bowel
abnormality.

Sacral decubitus extends to the lower sacrum and coccyx.

Aortic Atherosclerosis ([id number]-EL1.1). Abdominal aortic ectasia.
Atherosclerotic changes aortic branch vessels with attenuated
inferior mesenteric artery.

Coronary artery calcifications.
